# Patient Record
Sex: Female | Born: 1965 | Race: White | Hispanic: Yes | Marital: Married | Smoking: Never smoker
Health system: Southern US, Community
[De-identification: ages and names within clinical notes are randomized; demographics above are authoritative.]

## PROBLEM LIST (undated history)

## (undated) DIAGNOSIS — T148XXA Other injury of unspecified body region, initial encounter: Secondary | ICD-10-CM

## (undated) DIAGNOSIS — C50411 Malignant neoplasm of upper-outer quadrant of right female breast: Principal | ICD-10-CM

## (undated) DIAGNOSIS — C50919 Malignant neoplasm of unspecified site of unspecified female breast: Secondary | ICD-10-CM

## (undated) DIAGNOSIS — N2889 Other specified disorders of kidney and ureter: Secondary | ICD-10-CM

## (undated) HISTORY — PX: APPENDECTOMY: SHX54

## (undated) HISTORY — DX: Malignant neoplasm of upper-outer quadrant of right female breast: C50.411

## (undated) HISTORY — PX: BREAST BIOPSY: SHX20

---

## 2001-11-25 ENCOUNTER — Ambulatory Visit (HOSPITAL_COMMUNITY): Admission: RE | Admit: 2001-11-25 | Discharge: 2001-11-25 | Payer: Self-pay | Admitting: Family Medicine

## 2001-11-25 ENCOUNTER — Encounter: Payer: Self-pay | Admitting: Family Medicine

## 2003-05-27 ENCOUNTER — Other Ambulatory Visit: Admission: RE | Admit: 2003-05-27 | Discharge: 2003-05-27 | Payer: Self-pay | Admitting: Gynecology

## 2003-06-21 ENCOUNTER — Ambulatory Visit (HOSPITAL_COMMUNITY): Admission: RE | Admit: 2003-06-21 | Discharge: 2003-06-21 | Payer: Self-pay | Admitting: Gynecology

## 2006-05-14 ENCOUNTER — Inpatient Hospital Stay (HOSPITAL_COMMUNITY): Admission: AD | Admit: 2006-05-14 | Discharge: 2006-05-14 | Payer: Self-pay | Admitting: Family Medicine

## 2006-05-15 ENCOUNTER — Inpatient Hospital Stay: Admission: AD | Admit: 2006-05-15 | Discharge: 2006-05-15 | Payer: Self-pay | Admitting: Family Medicine

## 2006-05-15 ENCOUNTER — Inpatient Hospital Stay (HOSPITAL_COMMUNITY): Admission: AD | Admit: 2006-05-15 | Discharge: 2006-05-15 | Payer: Self-pay | Admitting: Gynecology

## 2011-10-09 ENCOUNTER — Ambulatory Visit: Payer: Self-pay | Admitting: Family Medicine

## 2011-10-09 VITALS — BP 126/75 | HR 66 | Temp 98.2°F | Resp 16 | Ht 61.0 in | Wt 157.0 lb

## 2011-10-09 DIAGNOSIS — R1031 Right lower quadrant pain: Secondary | ICD-10-CM

## 2011-10-09 LAB — POCT CBC
Granulocyte percent: 50.7 %G (ref 37–80)
HCT, POC: 38.2 % (ref 37.7–47.9)
Hemoglobin: 12.4 g/dL (ref 12.2–16.2)
Lymph, poc: 3.1 (ref 0.6–3.4)
MCH, POC: 28.8 pg (ref 27–31.2)
MCHC: 32.5 g/dL (ref 31.8–35.4)
MCV: 88.7 fL (ref 80–97)
MID (cbc): 0.5 (ref 0–0.9)
MPV: 10.1 fL (ref 0–99.8)
POC Granulocyte: 3.7 (ref 2–6.9)
POC LYMPH PERCENT: 42.9 %L (ref 10–50)
POC MID %: 6.4 %M (ref 0–12)
Platelet Count, POC: 226 10*3/uL (ref 142–424)
RBC: 4.31 M/uL (ref 4.04–5.48)
RDW, POC: 13 %
WBC: 7.2 10*3/uL (ref 4.6–10.2)

## 2011-10-09 LAB — POCT WET PREP WITH KOH
Clue Cells Wet Prep HPF POC: 20
KOH Prep POC: NEGATIVE
RBC Wet Prep HPF POC: NEGATIVE
Trichomonas, UA: NEGATIVE
Yeast Wet Prep HPF POC: NEGATIVE

## 2011-10-09 LAB — POCT UA - MICROSCOPIC ONLY
Casts, Ur, LPF, POC: NEGATIVE
Crystals, Ur, HPF, POC: NEGATIVE
Mucus, UA: NEGATIVE
Yeast, UA: NEGATIVE

## 2011-10-09 LAB — POCT URINALYSIS DIPSTICK
Bilirubin, UA: NEGATIVE
Blood, UA: NEGATIVE
Glucose, UA: NEGATIVE
Leukocytes, UA: NEGATIVE
Nitrite, UA: NEGATIVE
Spec Grav, UA: 1.015
Urobilinogen, UA: 0.2
pH, UA: 8

## 2011-10-09 LAB — POCT URINE PREGNANCY: Preg Test, Ur: NEGATIVE

## 2011-10-09 NOTE — Progress Notes (Signed)
  Subjective:    Patient ID: Kayla Johnston, female    DOB: 1966/06/09, 46 y.o.   MRN: [id number]  HPI    Review of Systems     Objective:   Physical Exam  Genitourinary: Vagina normal and uterus normal.       TTP 2-3cm higher than R ovary.  Wet prep taken.          Assessment & Plan:

## 2011-10-09 NOTE — Progress Notes (Signed)
This is a 46 year old Hispanic woman is seen with her partner with a history of 5 days of right lower quadrant pain. It lasted for. Was one month ago and was heavier than normal. She has a history of blood in the urine about 5 years ago. She's not sure she's had kidney stones in the past. The pain is sharp and radiates from costophrenic angle down to her right groin. Status post appendectomy 10 years ago  Objective: No acute distress, patient cooperative and is seen with her partner  Abdomen is soft, tender in the right lower quadrant pain without guarding or rebound  Respirations normal  Results for orders placed in visit on 10/09/11  POCT CBC      Component Value Range   WBC 7.2  4.6 - 10.2 (K/uL)   Lymph, poc 3.1  0.6 - 3.4    POC LYMPH PERCENT 42.9  10 - 50 (%L)   MID (cbc) 0.5  0 - 0.9    POC MID % 6.4  0 - 12 (%M)   POC Granulocyte 3.7  2 - 6.9    Granulocyte percent 50.7  37 - 80 (%G)   RBC 4.31  4.04 - 5.48 (M/uL)   Hemoglobin 12.4  12.2 - 16.2 (g/dL)   HCT, POC 16.1  09.6 - 47.9 (%)   MCV 88.7  80 - 97 (fL)   MCH, POC 28.8  27 - 31.2 (pg)   MCHC 32.5  31.8 - 35.4 (g/dL)   RDW, POC 04.5     Platelet Count, POC 226  142 - 424 (K/uL)   MPV 10.1  0 - 99.8 (fL)  POCT UA - MICROSCOPIC ONLY      Component Value Range   WBC, Ur, HPF, POC 1-2     RBC, urine, microscopic 3-5     Bacteria, U Microscopic 2+     Mucus, UA neg     Epithelial cells, urine per micros 3-5     Crystals, Ur, HPF, POC neg     Casts, Ur, LPF, POC neg     Yeast, UA neg    POCT URINALYSIS DIPSTICK      Component Value Range   Color, UA yellow     Clarity, UA cloudy     Glucose, UA neg     Bilirubin, UA neg     Ketones, UA trace     Spec Grav, UA 1.015     Blood, UA neg     pH, UA 8.0     Protein, UA trace     Urobilinogen, UA 0.2     Nitrite, UA neg     Leukocytes, UA Negative    A:  No evidence for kidney stone.  White count suggests no acute infection. P:  culturelle and miralax for now.   Return for worsening sx

## 2013-06-10 ENCOUNTER — Ambulatory Visit: Payer: Self-pay | Admitting: Physician Assistant

## 2013-06-10 VITALS — BP 124/78 | HR 69 | Temp 98.9°F | Resp 16 | Ht 61.0 in | Wt 158.2 lb

## 2013-06-10 DIAGNOSIS — N644 Mastodynia: Secondary | ICD-10-CM

## 2013-06-10 MED ORDER — MELOXICAM 15 MG PO TABS: 15.0000 mg | ORAL_TABLET | Freq: Every day | ORAL | Status: DC

## 2013-06-10 MED ORDER — TRAMADOL HCL 50 MG PO TABS: 50.0000 mg | ORAL_TABLET | Freq: Three times a day (TID) | ORAL | Status: DC | PRN

## 2013-06-10 NOTE — Patient Instructions (Signed)
Wear a supportive bra.  Use ice or heat to the area - whichever feels better.  Take the meloxicam once daily (do not take any additional advil, ibuprofen, or aleve.)  You can take Tylenol every 8 hours if needed.  If the pain is really bad (especially at night) you can take the tramadol.  You will get a phone call about scheduling your mammogram.

## 2013-06-10 NOTE — Progress Notes (Signed)
Subjective:    Patient ID: Telford Nab, female    DOB: 05/02/1966, 47 y.o.   MRN: [id number]  HPI   Ms. Duprey is a very pleasant 47 yr old female accompanied today by her husband.  She complains  Of sudden onset of a left breast knot and breast pain.  This started 3 days ago.  The breast lump and pain began at the same time.  She denies previous breast lumps.  She denies previous breast pain.  The right breast is unaffected.  She denies discharge from the breast.  She denies skin changes.  She denies rash.  States the pain wraps around under her left arm.  She describes the pain as burning.  Unsure about chicken pox history.  Difficult to sleep at night due to pain.  She has not taken anything for pain but states the pain is unbearable.  She denies fever or chills.  No associated symptoms.  She has never had a mammogram.  LMP 05/31/13.     Review of Systems  Constitutional: Negative for fever and chills.  Respiratory: Negative.   Cardiovascular: Negative.   Gastrointestinal: Negative.   Musculoskeletal: Negative.   Skin: Negative for color change, rash and wound.  Neurological: Negative.        Objective:   Physical Exam  Vitals reviewed. Constitutional: She is oriented to person, place, and time. She appears well-developed and well-nourished. No distress.  HENT:  Head: Normocephalic and atraumatic.  Eyes: Conjunctivae are normal. No scleral icterus.  Cardiovascular: Normal rate, regular rhythm and normal heart sounds.   Pulmonary/Chest: Effort normal and breath sounds normal. She has no wheezes. She has no rales. Right breast exhibits no mass, no nipple discharge, no skin change and no tenderness. Left breast exhibits tenderness. Left breast exhibits no nipple discharge and no skin change. Breasts are symmetrical.    Dense, tender area at approx 12 o'clock, just above areola; difficult to perform exam due to pain; no skin changes; pain is with palpation; no tenderness  into axilla or around to back  Abdominal: Soft. There is no tenderness.  Neurological: She is alert and oriented to person, place, and time.  Skin: Skin is warm and dry.  Psychiatric: She has a normal mood and affect. Her behavior is normal.        Assessment & Plan:  Breast pain, left - Plan: traMADol (ULTRAM) 50 MG tablet, meloxicam (MOBIC) 15 MG tablet, MM Digital Diagnostic Bilat, US Breast Left  Ms. Norell is a very pleasant 47 yr old female here with breast pain x 3 days.  On exam there are no skin changes, no nipple discharge.  The area just above the areola of the left breast is very tender with palpation.  Initially I questioned possible early shingles based on history but pain is clearly localized to the breast on exam.  She is afebrile, and there are no associated symptoms.  She has not tried anything for pain - will try Mobic and Tylenol.  Tramadol if needed.  Try hot or cold compresses. Supportive bra.  Order placed for mammogram and ultrasound - hope to obtain these as soon as possible.  Pt to RTC if worsening or new symptoms prior to mammo.   Meds ordered this encounter  Medications  . traMADol (ULTRAM) 50 MG tablet    Sig: Take 1 tablet (50 mg total) by mouth every 8 (eight) hours as needed.    Dispense:  30 tablet    Refill:  0  Order Specific Question:  Supervising Provider    Answer:  Ethelda Chick [[id number]]  . meloxicam (MOBIC) 15 MG tablet    Sig: Take 1 tablet (15 mg total) by mouth daily.    Dispense:  30 tablet    Refill:  1    Order Specific Question:  Supervising Provider    Answer:  Ethelda Chick [[id number]]    Loleta Dicker MHS, PA-C [facility] 11/19/20147:35 PM

## 2014-06-08 ENCOUNTER — Ambulatory Visit (INDEPENDENT_AMBULATORY_CARE_PROVIDER_SITE_OTHER): Payer: Self-pay | Admitting: Physician Assistant

## 2014-06-08 VITALS — BP 128/80 | HR 72 | Temp 98.4°F | Resp 17 | Ht 61.0 in | Wt 156.0 lb

## 2014-06-08 DIAGNOSIS — Z131 Encounter for screening for diabetes mellitus: Secondary | ICD-10-CM

## 2014-06-08 DIAGNOSIS — L299 Pruritus, unspecified: Secondary | ICD-10-CM

## 2014-06-08 DIAGNOSIS — R202 Paresthesia of skin: Secondary | ICD-10-CM

## 2014-06-08 DIAGNOSIS — L259 Unspecified contact dermatitis, unspecified cause: Secondary | ICD-10-CM

## 2014-06-08 DIAGNOSIS — R5383 Other fatigue: Secondary | ICD-10-CM

## 2014-06-08 LAB — POCT CBC
Granulocyte percent: 52.7 %G (ref 37–80)
HCT, POC: 43.4 % (ref 37.7–47.9)
Hemoglobin: 14 g/dL (ref 12.2–16.2)
Lymph, poc: 2.5 (ref 0.6–3.4)
MCH, POC: 29.1 pg (ref 27–31.2)
MCHC: 32.3 g/dL (ref 31.8–35.4)
MCV: 90 fL (ref 80–97)
MID (cbc): 0.4 (ref 0–0.9)
MPV: 8.6 fL (ref 0–99.8)
POC Granulocyte: 3.3 (ref 2–6.9)
POC LYMPH PERCENT: 41.1 %L (ref 10–50)
POC MID %: 6.2 %M (ref 0–12)
Platelet Count, POC: 221 10*3/uL (ref 142–424)
RBC: 4.83 M/uL (ref 4.04–5.48)
RDW, POC: 13.4 %
WBC: 6.2 10*3/uL (ref 4.6–10.2)

## 2014-06-08 LAB — GLUCOSE, POCT (MANUAL RESULT ENTRY): POC Glucose: 93 mg/dl (ref 70–99)

## 2014-06-08 MED ORDER — RANITIDINE HCL 150 MG PO TABS: 150.0000 mg | ORAL_TABLET | Freq: Two times a day (BID) | ORAL | Status: DC

## 2014-06-08 MED ORDER — PREDNISONE 20 MG PO TABS: ORAL_TABLET | ORAL | Status: AC

## 2014-06-08 MED ORDER — CETIRIZINE HCL 10 MG PO TABS: 10.0000 mg | ORAL_TABLET | Freq: Every day | ORAL | Status: DC

## 2014-06-08 NOTE — Progress Notes (Signed)
MRN: [id number] DOB: 1966/06/30  Subjective:   Kayla Johnston is a 48 y.o. female presenting for rash, fatigue and arm paresthesias.  Rash - started this am while at work, patient works for [employer], uses strong cleaning detergents, does not wear gloves, face ppe. States that her rash started on her cheeks bilaterally and was mildly uncomfortable progressed to severe itching and pain by 11:30. Rash has since spread to her lateral neck, right ear lobe, and forehead. She has tried Advil, sunblock cream and Zyrtec x1 with no relief. Denies eating new foods, using new cleaning agents, self hygiene products. Denies fevers, sob, wheezing, lip swelling, throat closing, hives, rash elsewhere on her body. Patient denies history of eczema, allergies, asthma. She does admit having had hives all over her body years ago, becoming sob, resolved on its own.  Fatigue - intermittent fatigue in the last year. No known precipitating factors. Admits stress at work can make her fatigue worse, feels down and blue intermittently without an obvious reason, feels dry coarse hair, intermittent constipation. Denies fevers, cough, night sweats, weight loss, joint swelling, photosensitivity. Cycles are regular, not heavy, no change in the last year. Generally sleeps well, 6-8 hours most nights. Diet is balanced, 3 meals a day, generally eats healthy occasional fatty meal. Denies smoking or alcohol use.  Arm paresthesias - felt intermittently mostly in her hands bilaterally to the finger tips but also occasionally in the forearm, worse in the evening after work. Massages sometimes help. No medications. No previous surgeries, neck pain, neck injuries, arm injuries, no foot paresthesias, decreased strength or loss of sensation.  Denies any other aggravating or relieving factors, no other questions or concerns.  No current medications.  No Known Allergies  No past medical history on file.  Past  Surgical History  Procedure Laterality Date  . Appendectomy     ROS As in subjective.   Objective:   Vitals: BP 128/80 mmHg  Pulse 72  Temp(Src) 98.4 F (36.9 C) (Oral)  Resp 17  Ht 5\' 1"  (1.549 m)  Wt 156 lb (70.761 kg)  BMI 29.49 kg/m2  SpO2 100%  LMP 06/08/2014  Physical Exam  Constitutional: She is oriented to person, place, and time and well-developed, well-nourished, and in no distress. No distress.  HENT:  Head: Normocephalic.  Right Ear: External ear normal.  Left Ear: External ear normal.  Nose: Nose normal.  Mouth/Throat: Oropharynx is clear and moist. No oropharyngeal exudate.  Eyes: Conjunctivae are normal. Pupils are equal, round, and reactive to light. Right eye exhibits no discharge. Left eye exhibits no discharge. No scleral icterus.  Neck: Normal range of motion. Neck supple. No thyromegaly present.  Cardiovascular: Normal rate, regular rhythm, normal heart sounds and intact distal pulses.  Exam reveals no gallop and no friction rub.   No murmur heard. Pulmonary/Chest: Effort normal and breath sounds normal. No stridor. No respiratory distress. She has no wheezes. She has no rales.  Abdominal: Soft. Bowel sounds are normal. She exhibits no distension and no mass. There is no tenderness.  Musculoskeletal: Normal range of motion. She exhibits no edema.  Lymphadenopathy:    She has no cervical adenopathy.  Neurological: She is alert and oriented to person, place, and time. She has normal reflexes.  Skin: Skin is warm and dry. Rash (macular papular rash over malar area, right lower ear lobe, hairline and lateral neck bilaterally) noted. Rash is not pustular, not vesicular and not urticarial. She is not diaphoretic. No erythema.  Psychiatric: Her mood appears anxious. Her affect is not labile. She is not agitated. She does not exhibit a depressed mood.    Results for orders placed or performed in visit on 06/08/14 (from the past 24 hour(s))  POCT glucose  (manual entry)     Status: None   Collection Time: 06/08/14  8:08 PM  Result Value Ref Range   POC Glucose 93 70 - 99 mg/dl  POCT CBC     Status: None   Collection Time: 06/08/14  8:09 PM  Result Value Ref Range   WBC 6.2 4.6 - 10.2 K/uL   Lymph, poc 2.5 0.6 - 3.4   POC LYMPH PERCENT 41.1 10 - 50 %L   MID (cbc) 0.4 0 - 0.9   POC MID % 6.2 0 - 12 %M   POC Granulocyte 3.3 2 - 6.9   Granulocyte percent 52.7 37 - 80 %G   RBC 4.83 4.04 - 5.48 M/uL   Hemoglobin 14.0 12.2 - 16.2 g/dL   HCT, POC 43.4 37.7 - 47.9 %   MCV 90.0 80 - 97 fL   MCH, POC 29.1 27 - 31.2 pg   MCHC 32.3 31.8 - 35.4 g/dL   RDW, POC 13.4 %   Platelet Count, POC 221 142 - 424 K/uL   MPV 8.6 0 - 99.8 fL    Assessment and Plan :   1. Contact dermatitis 2. Itching - Possibly due to cleaning agents from work, Rx steroid course, antihistamines, return to clinic if symptoms worsen, fail to resolve or as needed - predniSONE (DELTASONE) 20 MG tablet; Take 3 PO QAM x3days, 2 PO QAM x3days, 1 PO QAM x3days  Dispense: 18 tablet; Refill: 0 - cetirizine (ZYRTEC) 10 MG tablet; Take 1 tablet (10 mg total) by mouth daily.  Dispense: 30 tablet; Refill: 11 - ranitidine (ZANTAC) 150 MG tablet; Take 1 tablet (150 mg total) by mouth 2 (two) times daily.  Dispense: 60 tablet; Refill: 0  3. Other fatigue - Consider Hypothyroidism, labs pending - POCT CBC - TSH  4. Screening for diabetes mellitus - R/o diabetes d/t steroid use - POCT glucose (manual entry)  5. Paresthesia of hand, bilateral - Possible carpal tunnel syndrome, try wrist splint at night on right hand for 2 weeks, return to clinic if symptoms worsen, fail to resolve or as needed   Jaynee Eagles, PA-C [facility] [phone] 06/08/2014 9:06 PM

## 2014-06-08 NOTE — Patient Instructions (Addendum)
Tome 3 pastillas de 58m de Prednisone por 3 dias. Luego tome 2 pastillas por los siguientes 3 dias. Para terminar tome 1 pastilla por 3 mas dias hasta que se le acabe su medicamento, Prednisone.  Sus resultados dJournalist, newspaper Yo le mandare por correo sus resultados y le hablare dentro de dDIRECTV  Use una ferula de muneca para su brazo derecho por la noche por 2 semanas.   Por favor regrese para un examen fisico antes del ano nuevo.    Dermatitis de contacto (Contact Dermatitis) La dermatitis de contacto es una reaccin a ciertas sustancias que tocan la piel. Puede ser uArdelia Memsdermatitis de contacto irritante o alrgica. La dermatitis de contacto irritante no requiere exposicin previa a la sustancia que provoc la reaccin.La dermatitis alrgica slo ocurre si ha estado expuesto anteriormente a la sustancia. Al repetir la exposicin, el organismo reacciona a la sustancia.  CAUSAS  Muchas sustancias pueden causar dermatitis de contacto. La dermatitis irritante se produce cuando hay exposicin repetida a sustancias levemente irritantes, como por ejemplo:   Maquillaje.  Jabones.  Detergentes.  Lavandina.  cidos.  Sales metlicas, como el nquel. Las causas de la dermatitis alrgica son:   Plantas venenosas.  Sustancias qumicas (desodorantes, champs).  Bijouterie.  Ltex.  Neomicina en cremas con triple antibitico.  Conservantes en productos incluyendo en la ropa. SNTOMAS  En la zona de la piel que ha estado expuesta puede haber:   Sequedad o descamacin.  Enrojecimiento.  Grietas.  Picazn.  Dolor o sensacin de ardor.  Ampollas. En el caso de la dermatitis de cRisk manager puede haber slo hinchazn en algunas zonas, como la boca o los genitales.  DIAGNSTICO  El mdico podr hacer el diagnstico realizando un examen fsico. En los casos en que la causa es incierta y se sospecha una dermatitis de cPuhi le har una prueba en la  piel con un parche para determinar la causa de la dermatitis. TRATAMIENTO  El tratamiento incluye la proteccin de la piel de nuevos contactos con la sustancia irritante, evitando la sustancia en lo posible. Puede ser de utilidad colocar una barrera como cremas, polvos y gPort Austin El mdico tambin podr recomendar:   Cremas o pomadas con corticoides aplicadas 2 veces por da. Para un mejor efecto, humedezca la zona con agua fresca durante 20 minutos. Luego aplique el medicamento. Cubra la zona con un vendaje plstico. Puede almacenar la crema con corticoides en el refrigerador para tResearch scientist (medical)"refrescante" sobre la erupcin que har aliviar la picazn. Esto aliviar la picazn. En los casos ms graves ser necesario aplicar corticoides por va oral.  Ungentos con antibiticos o antibacterianos, si hay una infeccin en la piel.  Antihistamnicos en forma de locin o por va oral para calmar la picazn.  Lubricantes para mantener la humectacin de la piel.  La solucin de [place] para reducir el enrojecimiento y eConservation officer, historic buildingso para secar una erupcin que supura. Mezcle un paquete o tableta en dos tazas de agua fra. Moje un pao limpio en la solucin, escrralo un poco y colquelo en el rea afectada. Djelo en el lugar durante 30 minutos. Repita el procedimiento todas las veces que pueda a lo largo del dTraining and development officer  Hgase baos con almidn o bicarbonato todos los das si la zona es demasiado extensa como para cubrirla con una toallita. Algunas sustancias qumicas, como los lcalis o los cidos pueden daar la piel del mismo modo que uPlymouth Enjuague la piel durante 15 a  20 minutos con agua fra despus de la exposicin a esas sustancias. Tambin busque atencin mdica de inmediato. En los casos de piel muy irritada, ser necesario aplicar (vendajes), antibiticos y analgsicos.  INSTRUCCIONES PARA EL CUIDADO EN EL HOGAR   Evite lo que ha causado la erupcin.  Mantenga el rea de la piel afectada  sin contacto con el agua caliente, el jabn, la luz solar, las sustancias qumicas, sustancias cidas o todo lo que la irrite.  No se rasque la lesin. El rascado puede hacer que la erupcin se infecte.  Puede tomar baos con agua fresca para detener la picazn.  Tome slo medicamentos de venta libre o recetados, segn las indicaciones del mdico.  Consulting civil engineer a las visitas de control segn las indicaciones, para asegurarse de que la piel se est curando Product manager. SOLICITE ATENCIN MDICA SI:   El problema no mejora luego de 3 das de [place].  Se siente empeorar.  Observa signos de infeccin, como hinchazn, sensibilidad, inflamacin, enrojecimiento o aumenta la temperatura en la zona afectada.  Tiene nuevos problemas debido a los medicamentos. Document Released: 04/18/2005 Document Revised: 10/01/2011 [facility] Patient Information 2015 [place]. This information is not intended to replace advice given to you by your health care provider. Make sure you discuss any questions you have with your health care provider.     Sndrome del tnel carpiano  (Carpal Tunnel Syndrome)  El tnel carpiano es un espacio estrecho ubicado en el lado palmar de la [place]. Est formado por los huesos de la [place] y los ligamentos. Los nervios, vasos sanguneos y tendones pasan a travs del tnel carpiano. Los movimientos de la [place] o ciertas enfermedades pueden causar hinchazn del tnel. Esta hinchazn comprime el nervio principal en la mueca ((nervio mediano) y ocasiona un trastorno doloroso que se denomina sndrome del tnel carpiano. CAUSAS   Movimientos repetidos de [employer].  Lesiones en la [place].  Ciertas enfermedades como la artritis, la diabetes, el alcoholismo, el hipertiroidismo o la insuficiencia renal.  Obesidad.  Embarazo. SNTOMAS   Sensacin de "pinchazos" en los dedos o la mano.  Hormigueo o entumecimiento en los dedos o en la mano.  Sensacin dolorosa en todo  el brazo.  Dolor en la mueca que sube por el brazo hasta el hombro.  Dolor que baja por la mano o los dedos.  Sensacin de [employer]. DIAGNSTICO  El mdico le har una historia clnica y un examen fsico. Puede ser necesario hacer un electromiograma. Esta prueba mide las seales elctricas enviadas por los msculos. Generalmente el paso de las seales elctricas es impedido por el sndrome del tnel carpiano. Tambin puede ser necesario que le tomen radiografas.  TRATAMIENTO  El sndrome del tnel carpiano puede curarse espontneamente sin tratamiento. El [employer] indicar el uso de un cabestrillo para la [place] o que tome medicamentos como antiinflamatorios no esteroides. Las inyecciones de cortisona pueden ayudar. A veces es necesaria la ciruga para liberar el nervio comprimido.  Moclips todos los medicamentos segn le indic su mdico. Slo tome medicamentos de venta libre o recetados para Glass blower/designer, las molestias o bajar la fiebre segn las indicaciones de su mdico.  Si le aconsejaron usar un cabestrillo para evitar que la [place] se doble, selo como le indicaron. Es importante que use el cabestrillo durante la noche. selo mientras sienta dolor o adormecimiento en la mano, el brazo o la [place]. Esto puede durar entre 1 y  2 meses.  Haga reposar la [place] de toda actividad que le cause dolor. Si sus sntomas estn relacionados con Leander Rams, deber conversar con su empleador acerca de la posibilidad de cambiar a una tarea que no requiera el uso de la [place].  Aplique hielo en la mueca despus de los perodos prolongados de [place].  Ponga el hielo en una bolsa plstica.  Colquese una toalla entre la piel y la bolsa de hielo.  Deje el hielo en el lugar durante 15 a 20 minutos, 3 a 4 veces por da.  Cumpla con todas las visitas de control, segn le indique su mdico. Aqu se incluyen derivaciones a un ortopedista,  fisioterapia y rehabilitacin. Elmer Bales en obtener la asistencia necesaria puede dar como resultado una demora o fracaso en la curacin. SOLICITE ATENCIN MDICA DE INMEDIATO SI:   Desarrolla nuevos e inexplicables sntomas.  Los sntomas actuales empeoran y la medicacin no los [place]. ASEGRESE DE QUE:   Comprende estas instrucciones.  Controlar su enfermedad.  Solicitar ayuda de inmediato si no mejora o si empeora. Document Released: 07/09/2005 Document Revised: 04/02/2012 [facility] Patient Information 2015 [place]. This information is not intended to replace advice given to you by your health care provider. Make sure you discuss any questions you have with your health care provider.

## 2014-06-09 LAB — TSH: TSH: 4.284 u[IU]/mL (ref 0.350–4.500)

## 2014-06-10 ENCOUNTER — Encounter: Payer: Self-pay | Admitting: Urgent Care

## 2014-06-10 ENCOUNTER — Telehealth: Payer: Self-pay | Admitting: Urgent Care

## 2014-06-10 NOTE — Telephone Encounter (Signed)
Spoke with patient about results and plan for follow up. Advised that if rash worsens or does not resolve after 1-2 weeks, please return to clinic for re-evaluation. Advised her to schedule a physical exam as well since she has not had one in years.  Jaynee Eagles, PA-C [facility] [phone] 06/10/2014  3:41 PM

## 2014-06-11 NOTE — Progress Notes (Signed)
I was directly involved with the patient's care and agree with the diagnosis and treatment plan.

## 2015-06-25 ENCOUNTER — Emergency Department (HOSPITAL_COMMUNITY)
Admission: EM | Admit: 2015-06-25 | Discharge: 2015-06-25 | Disposition: A | Discharge status: Home / Self Care | Payer: Self-pay | Attending: Emergency Medicine | Admitting: Emergency Medicine

## 2015-06-25 ENCOUNTER — Encounter (HOSPITAL_COMMUNITY): Payer: Self-pay | Admitting: Emergency Medicine

## 2015-06-25 DIAGNOSIS — N644 Mastodynia: Secondary | ICD-10-CM | POA: Insufficient documentation

## 2015-06-25 DIAGNOSIS — Z791 Long term (current) use of non-steroidal anti-inflammatories (NSAID): Secondary | ICD-10-CM | POA: Insufficient documentation

## 2015-06-25 DIAGNOSIS — R110 Nausea: Secondary | ICD-10-CM | POA: Insufficient documentation

## 2015-06-25 DIAGNOSIS — Z79899 Other long term (current) drug therapy: Secondary | ICD-10-CM | POA: Insufficient documentation

## 2015-06-25 MED ORDER — HYDROMORPHONE HCL 1 MG/ML IJ SOLN
0.5000 mg | Freq: Once | INTRAMUSCULAR | Status: AC
  Administered 2015-06-25: 0.5 mg via INTRAVENOUS
  Filled 2015-06-25: qty 1

## 2015-06-25 MED ORDER — HYDROCODONE-ACETAMINOPHEN 10-325 MG PO TABS: 1.0000 | ORAL_TABLET | Freq: Four times a day (QID) | ORAL | Status: DC | PRN

## 2015-06-25 MED ORDER — SODIUM CHLORIDE 0.9 % IV BOLUS (SEPSIS)
1000.0000 mL | Freq: Once | INTRAVENOUS | Status: AC
  Administered 2015-06-25: 1000 mL via INTRAVENOUS

## 2015-06-25 MED ORDER — ONDANSETRON HCL 4 MG/2ML IJ SOLN
4.0000 mg | Freq: Once | INTRAMUSCULAR | Status: AC
  Administered 2015-06-25: 4 mg via INTRAVENOUS
  Filled 2015-06-25: qty 2

## 2015-06-25 MED ORDER — ONDANSETRON 4 MG PO TBDP: 4.0000 mg | ORAL_TABLET | Freq: Three times a day (TID) | ORAL | Status: DC | PRN

## 2015-06-25 NOTE — Discharge Instructions (Signed)
Please be sure to go to the breast clinic as scheduled in four days.  Return here for concerning changes in your condition.

## 2015-06-25 NOTE — ED Provider Notes (Signed)
CSN: [id number]     Arrival date & time 06/25/15  1300 History   First MD Initiated Contact with Patient 06/25/15 1333     Chief Complaint  Patient presents with  . nausea/pain     Patient presents with concern of pain, swelling, nausea. Pain is focally in the right lateral breast, radiating towards the right arm, right scapula. Patient has been present for at least one week, but possibly subtly more so. Over the past few days in particular, the patient has had increasingly severe, sharp pain, nausea, anorexia. No vomiting, no chest pain, no dyspnea. Patient has been taking tramadol, NSAIDs without relief. No new fever, confusion, disorientation. Patient has scheduled ultrasound of the breast on December 7, in 4 days. Patient has no history of cancer herself, denies substantial medical problems. HPI  History reviewed. No pertinent past medical history. Past Surgical History  Procedure Laterality Date  . Appendectomy     Family History  Problem Relation Age of Onset  . Cancer Sister   . Cancer Paternal Aunt    Social History  Substance Use Topics  . Smoking status: Never Smoker   . Smokeless tobacco: None  . Alcohol Use: No   OB History    No data available     Review of Systems  Constitutional:       Per HPI, otherwise negative  HENT:       Per HPI, otherwise negative  Respiratory:       Per HPI, otherwise negative  Cardiovascular:       Per HPI, otherwise negative  Gastrointestinal: Negative for vomiting.  Endocrine:       Negative aside from HPI  Genitourinary:       Neg aside from HPI   Musculoskeletal:       Per HPI, otherwise negative  Skin: Negative.   Neurological: Negative for syncope.      Allergies  Review of patient's allergies indicates no known allergies.  Home Medications   Prior to Admission medications   Medication Sig Start Date End Date Taking? Authorizing Provider  meloxicam (MOBIC) 15 MG tablet Take 15 mg by mouth daily.   Yes  Historical Provider, MD  ranitidine (ZANTAC) 150 MG tablet Take 1 tablet (150 mg total) by mouth 2 (two) times daily. 06/08/14  Yes Jaynee Eagles, PA-C  traMADol (ULTRAM) 50 MG tablet Take 50 mg by mouth every 8 (eight) hours as needed for moderate pain.   Yes Historical Provider, MD  cetirizine (ZYRTEC) 10 MG tablet Take 1 tablet (10 mg total) by mouth daily. Patient not taking: Reported on 06/25/2015 06/08/14   Jaynee Eagles, PA-C   BP 155/87 mmHg  Pulse 73  Temp(Src) 98.4 F (36.9 C) (Oral)  Resp 16  SpO2 100%  LMP 06/11/2015 Physical Exam  Constitutional: She is oriented to person, place, and time. She appears well-developed and well-nourished. No distress.  HENT:  Head: Normocephalic and atraumatic.  Eyes: Conjunctivae and EOM are normal.  Cardiovascular: Normal rate and regular rhythm.   Pulmonary/Chest: Effort normal and breath sounds normal. No stridor. No respiratory distress.    Abdominal: She exhibits no distension.  Musculoskeletal: She exhibits no edema.  Lymphadenopathy:  No appreciable right axilla adenopathy  Neurological: She is alert and oriented to person, place, and time. No cranial nerve deficit.  Skin: Skin is warm and dry.  Psychiatric: She has a normal mood and affect.  Nursing note and vitals reviewed.   ED Course  Procedures (including critical care  time)  3:42 PM With IVF, zofran, dilaudid, the Sx are improved,though there is a bit of dizziness.  Patient will f/u at the breast center, and will call in two days (Monday) to see if the appointment can be expedited.   MDM  Patient presents with concern of ongoing pain in the right lateral breast. Here the patient is afebrile, and there are no superficial changes consistent with mastitis. Low suspicion for deep space infection. Patient improved here with pain medication, antiemetics and patient already has ultrasound appointment in less than 4 days.   Carmin Muskrat, MD 06/25/15 [phone]

## 2015-06-25 NOTE — ED Notes (Signed)
Per pt, states right breast mass-has appointment on the 12 th-states increased pain causing nausea

## 2015-06-25 NOTE — ED Notes (Signed)
Pt also endorses the pain radiating down her R arm and R scapula.

## 2015-06-30 ENCOUNTER — Ambulatory Visit (HOSPITAL_COMMUNITY)
Admission: RE | Admit: 2015-06-30 | Discharge: 2015-06-30 | Disposition: A | Discharge status: Home / Self Care | Payer: Self-pay | Source: Ambulatory Visit | Attending: Obstetrics and Gynecology | Admitting: Obstetrics and Gynecology

## 2015-06-30 ENCOUNTER — Encounter (HOSPITAL_COMMUNITY): Payer: Self-pay

## 2015-06-30 ENCOUNTER — Other Ambulatory Visit: Payer: Self-pay | Admitting: Radiology

## 2015-06-30 VITALS — BP 112/72 | Temp 98.6°F | Ht 63.0 in | Wt 164.0 lb

## 2015-06-30 DIAGNOSIS — N6311 Unspecified lump in the right breast, upper outer quadrant: Secondary | ICD-10-CM

## 2015-06-30 DIAGNOSIS — Z1239 Encounter for other screening for malignant neoplasm of breast: Secondary | ICD-10-CM

## 2015-06-30 NOTE — Patient Instructions (Signed)
Educational materials on self breast awareness given. Explained to Kayla Johnston that she did not need a Pap smear today due to last Pap smear was in August 2014 per patient. Let her know [facility] will cover Pap smears every 3 years unless has a history of abnormal Pap smears. Referred patient to [facility] for a right breast biopsy per recommendation. Appointment scheduled for Thursday, June 30, 2015 at 1500. Patient aware of appointment and will be there. Kayla Johnston verbalized understanding.  Jaeshaun Riva, Arvil [place], RN 1:09 PM

## 2015-06-30 NOTE — Progress Notes (Signed)
Patient referred to [facility] by [facility] due to recommending a right breast biopsy x 2. Diagnostic mammogram completed 06/29/2015 at [facility]. Complaints of right breast lump x 1 month. Complaints of right breast pain x 1 week that comes and goes. Patient rated pain at a 10 out of 10.  Pap Smear:  Pap smear not completed today. Last Pap smear was in August 2014 and normal per patient. Per patient has no history of an abnormal Pap smear. No Pap smear results in EPIC.  Physical exam: Breasts Breasts symmetrical. No skin abnormalities bilateral breasts. No nipple retraction bilateral breasts. No nipple discharge bilateral breasts. No lymphadenopathy. No lumps palpated left breast. Palpated a lump within the right breast at 10 o'clock 2 cm from the nipple. Patient complained of tenderness when palpated left lower breast and right outer breast on exam. Referred patient to [facility] for a right breast biopsy per recommendation. Appointment scheduled for Thursday, June 30, 2015 at 1500.    Pelvic/Bimanual No Pap smear completed today since last Pap smear was in August 2014 per patient. Pap smear not indicated per [facility]CP guidelines.   Used interpreter Benjamine Sprague.

## 2015-07-04 ENCOUNTER — Other Ambulatory Visit: Payer: Self-pay | Admitting: Radiology

## 2015-07-04 DIAGNOSIS — C801 Malignant (primary) neoplasm, unspecified: Secondary | ICD-10-CM

## 2015-07-05 ENCOUNTER — Encounter: Payer: Self-pay | Admitting: *Deleted

## 2015-07-05 ENCOUNTER — Emergency Department (HOSPITAL_COMMUNITY): Payer: Self-pay

## 2015-07-05 ENCOUNTER — Encounter (HOSPITAL_COMMUNITY): Payer: Self-pay | Admitting: Emergency Medicine

## 2015-07-05 ENCOUNTER — Telehealth: Payer: Self-pay | Admitting: *Deleted

## 2015-07-05 ENCOUNTER — Emergency Department (HOSPITAL_COMMUNITY)
Admission: EM | Admit: 2015-07-05 | Discharge: 2015-07-05 | Disposition: A | Discharge status: Home / Self Care | Payer: Self-pay | Attending: Emergency Medicine | Admitting: Emergency Medicine

## 2015-07-05 DIAGNOSIS — C50411 Malignant neoplasm of upper-outer quadrant of right female breast: Secondary | ICD-10-CM

## 2015-07-05 DIAGNOSIS — Z853 Personal history of malignant neoplasm of breast: Secondary | ICD-10-CM | POA: Insufficient documentation

## 2015-07-05 DIAGNOSIS — J069 Acute upper respiratory infection, unspecified: Secondary | ICD-10-CM | POA: Insufficient documentation

## 2015-07-05 DIAGNOSIS — B9789 Other viral agents as the cause of diseases classified elsewhere: Secondary | ICD-10-CM

## 2015-07-05 DIAGNOSIS — Z9889 Other specified postprocedural states: Secondary | ICD-10-CM | POA: Insufficient documentation

## 2015-07-05 DIAGNOSIS — J988 Other specified respiratory disorders: Secondary | ICD-10-CM

## 2015-07-05 HISTORY — DX: Malignant neoplasm of unspecified site of unspecified female breast: C50.919

## 2015-07-05 HISTORY — DX: Malignant neoplasm of upper-outer quadrant of right female breast: C50.411

## 2015-07-05 LAB — RAPID STREP SCREEN (MED CTR MEBANE ONLY): Streptococcus, Group A Screen (Direct): NEGATIVE

## 2015-07-05 MED ORDER — HYDROCODONE-ACETAMINOPHEN 7.5-325 MG/15ML PO SOLN: ORAL | Status: DC

## 2015-07-05 MED ORDER — ALBUTEROL SULFATE HFA 108 (90 BASE) MCG/ACT IN AERS
2.0000 | INHALATION_SPRAY | RESPIRATORY_TRACT | Status: DC | PRN
  Administered 2015-07-05: 2 via RESPIRATORY_TRACT
  Filled 2015-07-05: qty 6.7

## 2015-07-05 NOTE — ED Notes (Signed)
Pt is c/o chills, cough, sore throat, fever  Pt states sxs started last week

## 2015-07-05 NOTE — Telephone Encounter (Signed)
Confirmed [facility] for 07/06/15 at 0830 .  Instructions and contact information given. 

## 2015-07-05 NOTE — ED Provider Notes (Addendum)
CSN: [id number]     Arrival date & time 07/05/15  0139 History   First MD Initiated Contact with Patient 07/05/15 0409     Chief Complaint  Patient presents with  . Sore Throat     (Consider location/radiation/quality/duration/timing/severity/associated sxs/prior Treatment) HPI  This is a 49 year old female who underwent a breast biopsy 5 days ago. Since that time she has had a sore throat which has become moderate to severe, worse with swallowing. It is been accompanied by chills, subjective fever, cough and shortness of breath. She denies vomiting or diarrhea.  Past Medical History  Diagnosis Date  . Breast cancer [facility])    Past Surgical History  Procedure Laterality Date  . Appendectomy    . Breast biopsy     Family History  Problem Relation Age of Onset  . Cancer Sister     cervical   Social History  Substance Use Topics  . Smoking status: Never Smoker   . Smokeless tobacco: Never Used  . Alcohol Use: No   OB History    Gravida Para Term Preterm AB TAB SAB Ectopic Multiple Living   3    3  2 1   0     Review of Systems  All other systems reviewed and are negative.   Allergies  Review of patient's allergies indicates no known allergies.  Home Medications   Prior to Admission medications   Medication Sig Start Date End Date Taking? Authorizing Provider  acetaminophen (TYLENOL) 500 MG tablet Take 1,000 mg by mouth every 6 (six) hours as needed for mild pain.   Yes Historical Provider, MD  cetirizine (ZYRTEC) 10 MG tablet Take 1 tablet (10 mg total) by mouth daily. Patient not taking: Reported on 06/25/2015 06/08/14   Jaynee Eagles, PA-C  HYDROcodone-acetaminophen [facility]) 10-325 MG tablet Take 1 tablet by mouth every 6 (six) hours as needed for severe pain. Patient not taking: Reported on 07/05/2015 06/25/15   Carmin Muskrat, MD  ondansetron (ZOFRAN-ODT) 4 MG disintegrating tablet Take 1 tablet (4 mg total) by mouth every 8 (eight) hours as needed for nausea or  vomiting. Patient not taking: Reported on 06/30/2015 06/25/15   Carmin Muskrat, MD   BP 126/78 mmHg  Pulse 74  Temp(Src) 98.2 F (36.8 C) (Oral)  Resp 24  SpO2 96%  LMP 06/11/2015   Physical Exam  General: Well-developed, well-nourished female in no acute distress; appearance consistent with age of record HENT: normocephalic; atraumatic; no pharyngeal erythema or exudate Eyes: pupils equal, round and reactive to light; extraocular muscles intact Neck: supple; anterior cervical lymphadenopathy, L>R Heart: regular rate and rhythm Lungs: Decreased air movement bilaterally without wheezing Abdomen: soft; nondistended; nontender; bowel sounds present Extremities: No deformity; full range of motion; pulses normal Neurologic: Awake, alert and oriented; motor function intact in all extremities and symmetric; no facial droop Skin: Warm and dry Psychiatric: Flat affect    ED Course  Procedures (including critical care time)   MDM  Nursing notes and vitals signs, including pulse oximetry, reviewed.  Summary of this visit's results, reviewed by myself:  Labs:  Results for orders placed or performed during the hospital encounter of 07/05/15 (from the past 24 hour(s))  Rapid strep screen     Status: None   Collection Time: 07/05/15  4:20 AM  Result Value Ref Range   Streptococcus, Group A Screen (Direct) NEGATIVE NEGATIVE    Imaging Studies: Dg Chest 2 View  07/05/2015  CLINICAL DATA:  Acute onset of chills, cough, sore throat  and fever. Initial encounter. EXAM: CHEST  2 VIEW COMPARISON:  None. FINDINGS: The lungs are well-aerated and clear. There is no evidence of focal opacification, pleural effusion or pneumothorax. The heart is normal in size; the mediastinal contour is within normal limits. No acute osseous abnormalities are seen. IMPRESSION: No acute cardiopulmonary process seen. Electronically Signed   By: Garald Balding M.D.   On: 07/05/2015 04:59   Likely represents a viral  syndrome. Postoperative irritation due to endotracheal intubation is a possibility but would not explain the patient's other symptomatology.    Shanon Rosser, MD 07/05/15 0530  Shanon Rosser, MD 07/05/15 [phone]

## 2015-07-05 NOTE — ED Notes (Signed)
Spouse states pt recently had a breast biopsy and they received a call stating that it was positive for cancer  Pt is to see oncology next week

## 2015-07-05 NOTE — ED Notes (Signed)
Bed: WA04 Expected date:  Expected time:  Means of arrival:  Comments: 

## 2015-07-05 NOTE — ED Notes (Signed)
Pt ambulating independently w/ steady gait on d/c in no acute distress, A&Ox4. D/c instructions reviewed w/ pt and family - pt and family deny any further questions or concerns at present. Rx given x1  

## 2015-07-06 ENCOUNTER — Ambulatory Visit: Payer: Self-pay | Admitting: Physical Therapy

## 2015-07-06 ENCOUNTER — Ambulatory Visit (HOSPITAL_BASED_OUTPATIENT_CLINIC_OR_DEPARTMENT_OTHER): Payer: Self-pay | Admitting: Oncology

## 2015-07-06 ENCOUNTER — Encounter: Payer: Self-pay | Admitting: Oncology

## 2015-07-06 ENCOUNTER — Encounter: Payer: Self-pay | Admitting: Nurse Practitioner

## 2015-07-06 ENCOUNTER — Ambulatory Visit: Payer: Self-pay | Admitting: Surgery

## 2015-07-06 ENCOUNTER — Other Ambulatory Visit (HOSPITAL_BASED_OUTPATIENT_CLINIC_OR_DEPARTMENT_OTHER): Payer: Self-pay

## 2015-07-06 ENCOUNTER — Encounter: Payer: Self-pay | Admitting: Skilled Nursing Facility1

## 2015-07-06 ENCOUNTER — Encounter: Payer: Self-pay | Admitting: Physical Therapy

## 2015-07-06 ENCOUNTER — Encounter: Payer: Self-pay | Admitting: *Deleted

## 2015-07-06 ENCOUNTER — Ambulatory Visit: Payer: Self-pay | Attending: Surgery | Admitting: Physical Therapy

## 2015-07-06 ENCOUNTER — Ambulatory Visit
Admission: RE | Admit: 2015-07-06 | Discharge: 2015-07-06 | Disposition: A | Discharge status: Home / Self Care | Payer: Self-pay | Source: Ambulatory Visit | Attending: Radiation Oncology | Admitting: Radiation Oncology

## 2015-07-06 VITALS — BP 132/75 | HR 70 | Temp 98.2°F | Resp 18 | Ht 63.0 in | Wt 161.7 lb

## 2015-07-06 DIAGNOSIS — R293 Abnormal posture: Secondary | ICD-10-CM | POA: Insufficient documentation

## 2015-07-06 DIAGNOSIS — C50411 Malignant neoplasm of upper-outer quadrant of right female breast: Secondary | ICD-10-CM

## 2015-07-06 DIAGNOSIS — C50911 Malignant neoplasm of unspecified site of right female breast: Secondary | ICD-10-CM | POA: Insufficient documentation

## 2015-07-06 DIAGNOSIS — C50111 Malignant neoplasm of central portion of right female breast: Secondary | ICD-10-CM

## 2015-07-06 DIAGNOSIS — Z170 Estrogen receptor positive status [ER+]: Secondary | ICD-10-CM

## 2015-07-06 LAB — COMPREHENSIVE METABOLIC PANEL
ALT: 18 U/L (ref 0–55)
AST: 16 U/L (ref 5–34)
Albumin: 3.8 g/dL (ref 3.5–5.0)
Alkaline Phosphatase: 77 U/L (ref 40–150)
Anion Gap: 9 mEq/L (ref 3–11)
BUN: 10.2 mg/dL (ref 7.0–26.0)
CO2: 23 mEq/L (ref 22–29)
Calcium: 9.2 mg/dL (ref 8.4–10.4)
Chloride: 107 mEq/L (ref 98–109)
Creatinine: 0.8 mg/dL (ref 0.6–1.1)
EGFR: 85 mL/min/{1.73_m2} — ABNORMAL LOW (ref >90)
Glucose: 117 mg/dl (ref 70–140)
Potassium: 3.7 mEq/L (ref 3.5–5.1)
Sodium: 139 mEq/L (ref 136–145)
Total Bilirubin: 0.65 mg/dL (ref 0.20–1.20)
Total Protein: 7.5 g/dL (ref 6.4–8.3)

## 2015-07-06 LAB — CBC WITH DIFFERENTIAL/PLATELET
BASO%: 0.3 % (ref 0.0–2.0)
Basophils Absolute: 0 10*3/uL (ref 0.0–0.1)
EOS%: 1 % (ref 0.0–7.0)
Eosinophils Absolute: 0.1 10*3/uL (ref 0.0–0.5)
HCT: 40.6 % (ref 34.8–46.6)
HGB: 13.5 g/dL (ref 11.6–15.9)
LYMPH%: 13.7 % — ABNORMAL LOW (ref 14.0–49.7)
MCH: 29.3 pg (ref 25.1–34.0)
MCHC: 33.3 g/dL (ref 31.5–36.0)
MCV: 88 fL (ref 79.5–101.0)
MONO#: 0.4 10*3/uL (ref 0.1–0.9)
MONO%: 4.6 % (ref 0.0–14.0)
NEUT#: 7.5 10*3/uL — ABNORMAL HIGH (ref 1.5–6.5)
NEUT%: 80.4 % — ABNORMAL HIGH (ref 38.4–76.8)
Platelets: 211 10*3/uL (ref 145–400)
RBC: 4.61 10*6/uL (ref 3.70–5.45)
RDW: 13.2 % (ref 11.2–14.5)
WBC: 9.4 10*3/uL (ref 3.9–10.3)
lymph#: 1.3 10*3/uL (ref 0.9–3.3)

## 2015-07-06 NOTE — Progress Notes (Signed)
Subjective:     Patient ID: Kayla Johnston, female   DOB: August 20, 1965, 49 y.o.   MRN: [id number]  HPI   Review of Systems     Objective:   Physical Exam For the patient to understand and be given the tools to implement a healthy plant based diet during their cancer diagnosis.      Assessment:     Patient was seen today and found to be in good spirits and accompanied by her family. Pts ht 5'3'', wt 161, BMI 28.7. Pt had an interpreter for [place]. She had great questions and a very supportive family.      Plan:     Dietitian educated the patient on implementing a plant based diet by incorporating more plant proteins, fruits, and vegetables. As a part of a healthy routine physical activity was discussed. The importance of legitimate, evidence based information was discussed and examples were given. A folder of evidence based information with a focus on a plant based diet and general nutrition during cancer was given to the patient.  As a part of the continuum of care the cancer dietitian's contact information was given to the patient in the event they would like to have a follow up appointment.

## 2015-07-06 NOTE — Progress Notes (Signed)
[place] Social [facility]  Clinical Social Work was referred by medical oncologist to review and complete healthcare advance directives.  Clinical Social Worker met with patient and family in [place] office.  The patient designated [facility] as their primary healthcare agent.  Patient also completed healthcare living will.    Clinical Social Worker notarized documents and made copies for patient/family. Clinical Social Worker will send documents to medical records to be scanned into patient's chart. Clinical Social Worker encouraged patient/family to contact with any additional questions or concerns.  Johnnye Lana, MSW, LCSW, OSW-C Clinical Social Worker [facility] [phone]

## 2015-07-06 NOTE — Progress Notes (Signed)
Kayla Johnston is a very pleasant 49 y.o. female from [place], [place] with newly diagnosed invasive ductal carcinoma with DCIS of the right breast.  Biopsy results revealed the tumor's prognostic profile is ER positive, PR positive, and HER2/neu negative.   She presents today to the [facility]) for treatment consideration and recommendations from the breast surgeon, radiation oncologist, and medical oncologist.     I briefly met with Kayla Johnston during her [facility] visit today. We discussed the purpose of the Survivorship Clinic, which will include monitoring for recurrence, coordinating completion of age and gender-appropriate cancer screenings, promotion of overall wellness, as well as managing potential late/long-term side effects of anti-cancer treatments.    The treatment plan for Kayla Johnston will likely include surgery and anti-estrogen therapy.  As of today, the intent of treatment for Kayla Johnston is cure, therefore she will be eligible for the Survivorship Clinic upon her completion of treatment.  Her survivorship care plan (SCP) document will be drafted and updated throughout the course of her treatment trajectory. She will receive the SCP in an office visit with myself in the Survivorship Clinic once she has completed treatment.   Kayla Johnston was encouraged to ask questions and all questions were answered to her satisfaction.  She was given my business card and encouraged to contact me with any concerns regarding survivorship.  I look forward to participating in her care.   Kenn File, [place] [phone]

## 2015-07-06 NOTE — Therapy (Signed)
[place], [place], [address] Phone: [phone]   Fax:  [phone]  Physical Therapy Evaluation  Patient Details  Name: Kayla Johnston MRN: [id number] Date of Birth: 1965/09/13 Referring Provider: Dr. Erroll Luna  Encounter Date: 07/06/2015      PT End of Session - 07/06/15 2030    Visit Number 1   Number of Visits 1   PT Start Time 9753   PT Stop Time 0051  Also saw pt from 1145 - 1210   PT Time Calculation (min) 10 min   Activity Tolerance Patient tolerated treatment well   Behavior During Therapy [facility] for tasks assessed/performed      Past Medical History  Diagnosis Date  . Breast cancer ([place])   . Breast cancer of upper-outer quadrant of right female breast ([place]) 07/05/2015    Past Surgical History  Procedure Laterality Date  . Appendectomy    . Breast biopsy      There were no vitals filed for this visit.  Visit Diagnosis:  Breast cancer, female, right - Plan: PT plan of care cert/re-cert  Abnormal posture - Plan: PT plan of care cert/re-cert      Subjective Assessment - 07/06/15 2031    Pertinent History Patient was diagnosed on 06/29/15 with right ER/PR positive, HER2 negative breast cancer measuring 1.5 cm with a possible 6 cm of calcifications.            [facility] PT Assessment - 07/06/15 0001    Assessment   Medical Diagnosis Right breast cancer   Referring Provider Dr. Marcello Moores [place]   Onset Date/Surgical Date 06/29/15   Hand Dominance Right   Prior Therapy none   Precautions   Precautions Other (comment)   Precaution Comments Active breast cancer   Restrictions   Weight Bearing Restrictions No   Balance Screen   Has the patient fallen in the past 6 months Yes   How many times? 1  Slipped; not a balance issue   Has the patient had a decrease in activity level because of a fear of falling?  No   Is the patient reluctant to leave their home because of a  fear of falling?  No   Home Social worker Private residence   Living Arrangements Non-relatives/Friends   Available Help at Discharge Family   Prior Function   Level of Independence Independent   Vocation Unemployed   Vocation Requirements N/A   Leisure She does not exercise   Cognition   Overall Cognitive Status Within Functional Limits for tasks assessed   Posture/Postural Control   Posture/Postural Control Postural limitations   Postural Limitations Forward head;Rounded Shoulders   ROM / Strength   AROM / PROM / Strength AROM;Strength   AROM   AROM Assessment Site Shoulder   Right/Left Shoulder Right;Left   Right Shoulder Extension 46 Degrees   Right Shoulder Flexion 150 Degrees   Right Shoulder ABduction 166 Degrees   Right Shoulder Internal Rotation 71 Degrees   Right Shoulder External Rotation 81 Degrees   Left Shoulder Extension 52 Degrees   Left Shoulder Flexion 146 Degrees   Left Shoulder ABduction 155 Degrees   Left Shoulder Internal Rotation 70 Degrees   Left Shoulder External Rotation 78 Degrees   Strength   Overall Strength Within functional limits for tasks performed           LYMPHEDEMA/ONCOLOGY QUESTIONNAIRE - 07/06/15 2026    Type   Cancer Type Right breast cancer  Lymphedema Assessments   Lymphedema Assessments Upper extremities   Right Upper Extremity Lymphedema   10 cm Proximal to Olecranon Process 30.2 cm   Olecranon Process 24.8 cm   10 cm Proximal to Ulnar Styloid Process 24.3 cm   Just Proximal to Ulnar Styloid Process 15.9 cm   Across Hand at [employer] 18.2 cm   At [place] of 2nd Digit 6 cm   Left Upper Extremity Lymphedema   10 cm Proximal to Olecranon Process 30.3 cm   Olecranon Process 24.3 cm   10 cm Proximal to Ulnar Styloid Process 23.6 cm   Just Proximal to Ulnar Styloid Process 15.4 cm   Across Hand at [employer] 17.7 cm   At [place] of 2nd Digit 5.8 cm      Patient was instructed today in a home  exercise program today for post op shoulder range of motion. These included active assist shoulder flexion in sitting, scapular retraction, wall walking with shoulder abduction, and hands behind head external rotation.  She was encouraged to do these twice a day, holding 3 seconds and repeating 5 times when permitted by her physician.         PT Education - 07/06/15 2028    Education provided Yes   Education Details Lymphedema risk reduction and post op shoulder ROM HEP   Person(s) Educated Patient   Methods Explanation;Demonstration;Handout   Comprehension Returned demonstration;Verbalized understanding              Breast Clinic Goals - 07/06/15 2034    Patient will be able to verbalize understanding of pertinent lymphedema risk reduction practices relevant to her diagnosis specifically related to skin care.   Baseline No knowledge   Time 1   Period Days   Status Achieved   Patient will be able to return demonstrate and/or verbalize understanding of the post-op home exercise program related to regaining shoulder range of motion.   Baseline No knowledge   Time 1   Period Days   Status Achieved   Patient will be able to verbalize understanding of the importance of attending the postoperative After Breast Cancer Class for further lymphedema risk reduction education and therapeutic exercise.   Baseline No knowledge   Time 1   Period Days   Status Achieved              Plan - 07/06/15 2031    Clinical Impression Statement Patient was diagnosed on 06/29/15 with right ER/PR positive, HER2 negative breast cancer measuring 1.5 cm with a possible 6 cm of calcifications.  She is planning to have a bilateral mastectomy with a right sentinel node biopsy followed by anti-estrogen therapy.  She will benefit from post op PT to regain shoulder ROM.   Pt will benefit from skilled therapeutic intervention in order to improve on the following deficits Decreased strength;Decreased  knowledge of precautions;Pain;Impaired UE functional use;Decreased range of motion   Rehab Potential Excellent   Clinical Impairments Affecting Rehab Potential none   PT Frequency One time visit   PT Treatment/Interventions Therapeutic exercise;Patient/family education   PT Next Visit Plan No PT indicated until after surgery   Consulted and Agree with Plan of Care Patient;Family member/caregiver   Family Member Consulted boyfriend and niece     Patient will follow up at outpatient cancer rehab if needed following surgery.  If the patient requires physical therapy at that time, a specific plan will be dictated and sent to the referring physician for approval. The patient  was educated today on appropriate basic range of motion exercises to begin post operatively and the importance of attending the After Breast Cancer class following surgery.  Patient was educated today on lymphedema risk reduction practices as it pertains to recommendations that will benefit the patient immediately following surgery.  She verbalized good understanding.  No additional physical therapy is indicated at this time.       Problem List Patient Active Problem List   Diagnosis Date Noted  . Breast cancer of upper-outer quadrant of right female breast ([place]) 07/05/2015   Annia Friendly, PT 07/06/2015 8:37 PM  [place], [place], [address] Phone: [phone]   Fax:  [phone]  Name: Kayla Johnston MRN: [id number] Date of Birth: [birth year]

## 2015-07-06 NOTE — Progress Notes (Signed)
[place]  [phone] Fax:([phone]     ID: Kayla Johnston DOB: 10-10-1965  MR#: [id number]  OFB#:[id number]  Patient Care Team: Erroll Luna, MD as Consulting Physician (General Surgery) Chauncey Cruel, MD as Consulting Physician (Oncology) Thea Silversmith, MD as Consulting Physician (Radiation Oncology) [place] (Diagnostic Radiology) PCP: No primary care provider on file. GYN: OTHER MD:  CHIEF COMPLAINT:  Estrogen receptor positive breast cancer  CURRENT TREATMENT:  Awaiting definitive surgery   BREAST CANCER HISTORY:  the patient herself noted a new lump in the right breast sometime in September or October. She has no primary care physician , so it was not until December that she eventually brought it to medical attention.  Bilateral diagnostic mammography at [facility] 06/29/2015 with tomosynthesis from the breast composition to be category C. In the right breast upper outer quadrant there was an oval mass with indistinct margins which was palpable. There were also calcifications in the right breast at the 11:00 middle depth. There were no other mammographic findings. Ultrasonography of both breasts was performed the same day. In the left breast there was a 1 cm oval lesion at the 2:00 middle depth Ann a 1.3 cm lesion in the upper outer quadrant, both of which were felt to be probably benign but warranting repeat ultrasonography in 6 months.   In the right breast however there were multiple masses: a 1.1 cm irregular mass in the lower outer quadrant , a 0.6 cm irregular mass at the 8:00 middle depth , and a 3 cm lesion at the 11:00 middle depth , finally a benign-appearing 2.9 cm complex cyst in the right upper outer quadrant middle depth. There were internal septations.   On 06/30/2015 the patient underwent biopsy of a right breast upper outer quadrant mass as well as a biopsy of the calcifications in the right upper quadrant middle  depth. 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 This was estrogen receptor 100% positive, progesterone receptor 100% positive, with an MIB-1 of 25%, and no HER-2 amplification, the signals ratio being 1.33, and the number per cell 3.00. The area of calcifications was ductal carcinoma in situ, grade 2.   The patient's subsequent history is as detailed below  INTERVAL HISTORY: Kayla Johnston was evaluated in the multidisciplinary breast cancer clinic July 06 2015, accompanied by her significant other Kayla Johnston , and her niece Kayla Johnston. There also also a Patent attorney available. Her case was also presented in the multidisciplinary breast cancer conference that same morning. At that time a preliminary plan was proposed: obtaining an MRI, then an Oncotype , with consideration of radiation if the patient underwent breast conserving surgery.  Mastectomy was also brought up as an alternative. The patient was felt not to qualify for genetics unless she had at least one relative with breast or ovarian cancer.  REVIEW OF SYSTEMS: There were no specific symptoms leading to the original mammogram, which was routinely scheduled. The patient denies unusual headaches, visual changes, nausea, vomiting, stiff neck, dizziness, or gait imbalance. There has been no cough, phlegm production, or pleurisy, no chest pain or pressure, and no change in bowel or bladder habits. The patient denies fever, rash, bleeding, unexplained fatigue or unexplained weight loss.  The patient admits to mild insomnia changes, mild to moderate fatigue, seasonal allergies, and chronic low back pain which is not more intense or persistent than before.A detailed review of systems was otherwise entirely negative.  PAST MEDICAL HISTORY:  Past Medical History  Diagnosis Date  . Breast cancer ([place])   . Breast cancer of upper-outer quadrant of right female breast ([place]) 07/05/2015    PAST SURGICAL HISTORY: Past  Surgical History  Procedure Laterality Date  . Appendectomy    . Breast biopsy      FAMILY HISTORY Family History  Problem Relation Age of Onset  . Cancer Sister     cervical   the patient's father died at the age of 65 but the patient does not know the cause. The patient's mother is currently alive. She is 32 years old as of December 2016. The patient has 4 brothers and 4 sisters. There is no history of breast or ovarian cancer in the family to her knowledge  GYNECOLOGIC HISTORY:  Patient's last menstrual period was 06/11/2015.  menarche age 38. The patient is GX P0. She is still having regular periods, which last approximately 3-4 days and are scant. She is not having hot flashes or vaginal dryness problems. She is not interested in fertility preservation  SOCIAL HISTORY:   the patient is single.  She is not employed. She is not an Kayla Johnston speaker. Her significant other, [facility],  Is also at the home , as is his brother Building control surveyor.   They work at [employer] and odd jobs.    ADVANCED DIRECTIVES:  Not in place. At the initial clinic visit 07/08/2015 the patient was given the appropriate forms to complete and notarize at her discretion.  She tells me she intends to name Kayla Johnston as her healthcare power of attorney. He can be reached at [place]: Social History  Substance Use Topics  . Smoking status: Never Smoker   . Smokeless tobacco: Never Used  . Alcohol Use: No     Colonoscopy:  PAP: 2013?  Bone density:  Lipid panel:  No Known Allergies  Current Outpatient Prescriptions  Medication Sig Dispense Refill  . acetaminophen (TYLENOL) 500 MG tablet Take 1,000 mg by mouth every 6 (six) hours as needed for mild pain.    . [DISCONTINUED] cetirizine (ZYRTEC) 10 MG tablet Take 1 tablet (10 mg total) by mouth daily. (Patient not taking: Reported on 06/25/2015) 30 tablet 11  . [DISCONTINUED] ranitidine (ZANTAC) 150 MG tablet Take 1 tablet (150 mg  total) by mouth 2 (two) times daily. 60 tablet 0   No current facility-administered medications for this visit.    OBJECTIVE:  Middle-aged Spanish speaker who appears stated age  17 Vitals:   07/06/15 0938  BP: 132/75  Pulse: 70  Temp: 98.2 F (36.8 C)  Resp: 18     Body mass index is 28.65 kg/(m^2).    ECOG FS:1 - Symptomatic but completely ambulatory  Ocular: Sclerae unicteric, pupils equal, round and reactive to light Ear-nose-throat: Oropharynx clear and moist Lymphatic: No cervical or supraclavicular adenopathy Lungs no rales or rhonchi, good excursion bilaterally Heart regular rate and rhythm, no murmur appreciated Abd soft, nontender, positive bowel sounds MSK no focal spinal tenderness, no joint edema Neuro: non-focal, well-oriented, appropriate affect Breasts:  The right breast is status post recent biopsy. Overall the breast is rather lumpy. I do not palpate any right axillary adenopathy. The left breast is also lumpy. The left axilla is benign.   LAB RESULTS:  CMP     Component Value Date/Time   NA 139 07/06/2015 0926   K 3.7 07/06/2015 0926   CO2 23 07/06/2015 0926   GLUCOSE 117 07/06/2015 0926   BUN 10.2 07/06/2015  0926   CREATININE 0.8 07/06/2015 0926   CALCIUM 9.2 07/06/2015 0926   PROT 7.5 07/06/2015 0926   ALBUMIN 3.8 07/06/2015 0926   AST 16 07/06/2015 0926   ALT 18 07/06/2015 0926   ALKPHOS 77 07/06/2015 0926   BILITOT 0.65 07/06/2015 0926    INo results found for: SPEP, UPEP  Lab Results  Component Value Date   WBC 9.4 07/06/2015   NEUTROABS 7.5* 07/06/2015   HGB 13.5 07/06/2015   HCT 40.6 07/06/2015   MCV 88.0 07/06/2015   PLT 211 07/06/2015      Chemistry      Component Value Date/Time   NA 139 07/06/2015 0926   K 3.7 07/06/2015 0926   CO2 23 07/06/2015 0926   BUN 10.2 07/06/2015 0926   CREATININE 0.8 07/06/2015 0926      Component Value Date/Time   CALCIUM 9.2 07/06/2015 0926   ALKPHOS 77 07/06/2015 0926   AST 16  07/06/2015 0926   ALT 18 07/06/2015 0926   BILITOT 0.65 07/06/2015 0926       No results found for: LABCA2  No components found for: LABCA125  No results for input(s): INR in the last 168 hours.  Urinalysis    Component Value Date/Time   BILIRUBINUR neg 10/09/2011 1848   PROTEINUR trace 10/09/2011 1848   UROBILINOGEN 0.2 10/09/2011 1848   NITRITE neg 10/09/2011 1848   LEUKOCYTESUR Negative 10/09/2011 1848    STUDIES: Dg Chest 2 View  07/05/2015  CLINICAL DATA:  Acute onset of chills, cough, sore throat and fever. Initial encounter. EXAM: CHEST  2 VIEW COMPARISON:  None. FINDINGS: The lungs are well-aerated and clear. There is no evidence of focal opacification, pleural effusion or pneumothorax. The heart is normal in size; the mediastinal contour is within normal limits. No acute osseous abnormalities are seen. IMPRESSION: No acute cardiopulmonary process seen. Electronically Signed   By: Garald Balding M.D.   On: 07/05/2015 04:59    ASSESSMENT: 49 y.o.  [place] speaker status post biopsy of 2 areas in the right breast 06/30/2015, showing invasive ductal carcinoma , estrogen and progesterone receptor positive, HER-2 not amplified, with an MIB-1 of 25%, and ductal carcinoma in situ    (1) definitive surgery pending  (2)  Adjuvant chemotherapy as appropriate depending on surgical or oncotype results  (3) adjuvant radiation as appropriate   (4) antiestrogen therapy following completion of local therapy  PLAN: Ms. Unterreiner' situation is complex and we spent well over an hour discussing it. I oriented her to the meaning of cancer in general and breast cancer in particular. (When asked what she understands by "cancer", she said "death.") We also discussed the fact that her breast cancer is estrogen "eating" but not dependent on anti-HER-2. She understands she will benefit greatly from anti-estrogen pills, but not at all from anti-HER-2 immunotherapy.  The question  of chemotherapy will be resolved with an Oncotype. This will be sent from the definitive sample.  The real issue for Sharnell is local treatment. She understands there is no survival difference between lumpectomy plus radiation as compared with mastectomy. This assumes however that the patient will have routine follow-up including yearly mammography and physician visits. In short it assumes a level of medical care that this patient has not had in the past and may not have in the future.  For that reason she is interested in bilateral mastectomies. This will likely obviate the need for radiation, obviate the need for future mammography or other studies,  and will reduce the risk of local recurrence to near 0. She understands unfortunately her insurance will not provide for reconstruction.  However we  shouldbe able to assist her in getting bilateral breast  Prostheses.  After much discussion, then, the plan will be for bilateral mastectomies, with chemotherapy depending on the Oncotype result. In any case she will benefit from 5-10 years of antiestrogen once her local treatment has been completed  Roselee has a good understanding of the overall plan. She agrees with it. She knows the goal of treatment in her case is cure. She will call with any problems that may develop before her next visit here.  Chauncey Cruel, MD   07/06/2015 1:34 PM Medical Oncology and Hematology [facility] [address] [place], [place] [address] Tel. [phone]    Fax. [phone]

## 2015-07-06 NOTE — Progress Notes (Signed)
Radiation Oncology         [phone]) [phone] ________________________________  Initial outpatient Consultation - Date: 07/06/2015   Name: Kayla Johnston MRN: [id number]   DOB: Apr 16, 1966  REFERRING PHYSICIAN: Erroll Luna, MD  DIAGNOSIS AND STAGE: T2 N0 invasive ductal carcinoma of the right breast.    HISTORY OF PRESENT ILLNESS::Kayla Johnston is a 49 y.o. female who presented to the ER with complaints of pain in the right breast. She had never had a mammogram. She was referred and was found to have at least 6 cm of microcalcifications in the upper outer quadrant as well as multiple areas of asymmetry. A mass was also seen which on ultrasound was a cyst. Two stereotactic biopsies were performed on the microcalcifications. These showed DCIS and one biopsy showed Grade II invasive ductal caricnoma ER+ PR+ HER-2(-), Ki-67 20%. The clips were 1.5 cm apart. An MRI has been scheduled for next Tuesday. She has been enrolled in the [facility] program. Family history includes a sister with cervical cancer.   Kayla Johnston does not speak English and is accompanied by her husband, her daughter, and a [place] translator who helped dictate our visit today. She has an appointment currently scheduled on Monday, 12/19 for an MRI.   PREVIOUS RADIATION THERAPY: No  Past medical, social and family history were reviewed in the electronic chart. Review of symptoms was reviewed in the electronic chart. Medications were reviewed in the electronic chart.   PHYSICAL EXAM:  Vitals with BMI 07/06/2015  Height _0   Weight 161 lbs 11 oz  BMI 61.9  Systolic 509  Diastolic 75  Pulse 70  Respirations 18   A breast exam was not performed today. She is alert and oriented x3. Pleasant and appears her stated age.  Gynecologic History  Age at first menstrual period? 13  Are you still having periods? Yes Approximate date of last period? 06/16/15  If you are still having periods: Are your periods  regular? Yes  If you no longer have periods: Have you used hormone replacement? No  If YES, for how long? N/A When did you stop? N/A Obstetric History:  How many children have you carried to term? No Your age at first live birth? N/A  Pregnant now or trying to get pregnant? No  Have you used birth control pills or hormone shots for contraception? No  If so, for how long (or approximate dates)? N/A  Would you be interested in learning more about the options to preserve fertility? N/A Health Maintenance:  Have you ever had a colonoscopy? No If yes, date? N/A  Have you ever had a bone density? No If yes, date? N/A  Date of your last PAP smear? 2 years ago Date of your FIRST mammogram? 06/29/15  IMPRESSION: T2 N0 invasive ductal carcinoma of the right breast.  PLAN: She has elected for bilateral mastectomies at this time. We discussed the role of radiation in both the post mastectomy and in the breast conservation settings. We discussed radiation in the mastectomy setting of patients who have invasive tumors more than 5 cm in size and positive lymph nodes benefit from adjuvant radiation. We discussed the process of simulation and the placement of tattoos. We discussed six weeks of treatment as an outpatient. We discussed the possible side effects of treatment including skin redness, fatigue, and damage to critical normal structures including the ribs, lung, and heart. She will undergo her MRI and then proceed to surgery. Oncotype testing has been recommended to determine  her need for chemotherapy. I will plan on seeing her back when her initial treatments are complete.   I spent 60 minutes  face to face with the patient and more than 50% of that time was spent in counseling and/or coordination of care.   ------------------------------------------------  Thea Silversmith, MD  This document serves as a record of services personally performed by Thea Silversmith, MD. It was created on her behalf by  Arlyce Harman, a trained medical scribe. The creation of this record is based on the scribe's personal observations and the provider's statements to them. This document has been checked and approved by the attending provider.

## 2015-07-06 NOTE — Patient Instructions (Signed)

## 2015-07-06 NOTE — H&P (Signed)
Kayla Johnston 07/06/2015 8:01 AM Location: [facility] Patient #: [id number] DOB: 12-13-1965 Married / Language: Spanish / Race: White Female  History of Present Illness Kayla Moores A. Kayla [name] MD; 07/06/2015 11:38 AM) Patient words: PATIENT SENT AT THE REQUEST OF [facility]. [facility] FOR RIGHT BREAST CANCER. This was detected on screening mammogram. A 1 cm central mass noted core bight. 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 This is er pr positive. Positive HER-2/neu negative. She speaks very little english accompanied by her family and a Optometrist today provided by [employer]. Patient denies anyproblems in either breast.(pain, nipple discharge or change in either breast.) Denies any family history.  The patient is a 49 year old female.   Other Problems Kayla Slipper, RN; 07/06/2015 8:01 AM) Gastroesophageal Reflux Disease Lump In Breast  Past Surgical History Kayla Slipper, RN; 07/06/2015 8:01 AM) Appendectomy  Diagnostic Studies History Kayla Slipper, RN; 07/06/2015 8:01 AM) Colonoscopy never Mammogram within last year Pap Smear 1-5 years ago  Medication History Kayla Slipper, RN; 07/06/2015 8:02 AM) No Current Medications Medications Reconciled  Social History Kayla Slipper, RN; 07/06/2015 8:01 AM) Caffeine use Carbonated beverages, Coffee. No alcohol use No drug use Tobacco use Never smoker.  Family History Kayla Slipper, RN; 07/06/2015 8:01 AM) Hypertension Mother.  Pregnancy / Birth History Kayla Slipper, RN; 07/06/2015 8:01 AM) Age at menarche 27 years. Gravida 2 Maternal age 31-35 Para 0 Regular periods     Review of Systems Kayla Slipper RN; 07/06/2015 8:01 AM) General Present- Fatigue and Weight Loss. Not Present- Appetite Loss, Chills, Fever, Night Sweats and Weight Gain. Skin Not Present- Change in Wart/Mole, Dryness, Hives, Jaundice, New Lesions, Non-Healing Wounds, Rash and Ulcer. HEENT Present- Sore Throat and Wears  glasses/contact lenses. Not Present- Earache, Hearing Loss, Hoarseness, Nose Bleed, Oral Ulcers, Ringing in the Ears, Seasonal Allergies, Sinus Pain, Visual Disturbances and Yellow Eyes. Breast Present- Breast Pain. Not Present- Breast Mass, Nipple Discharge and Skin Changes. Cardiovascular Not Present- Chest Pain, Difficulty Breathing Lying Down, Leg Cramps, Palpitations, Rapid Heart Rate, Shortness of Breath and Swelling of Extremities. Gastrointestinal Not Present- Abdominal Pain, Bloating, Bloody Stool, Change in Bowel Habits, Chronic diarrhea, Constipation, Difficulty Swallowing, Excessive gas, Gets full quickly at meals, Hemorrhoids, Indigestion, Nausea, Rectal Pain and Vomiting. Female Genitourinary Not Present- Frequency, Nocturia, Painful Urination, Pelvic Pain and Urgency. Musculoskeletal Present- Back Pain. Not Present- Joint Pain, Joint Stiffness, Muscle Pain, Muscle Weakness and Swelling of Extremities. Neurological Present- Numbness. Not Present- Decreased Memory, Fainting, Headaches, Seizures, Tingling, Tremor, Trouble walking and Weakness. Psychiatric Present- Change in Sleep Pattern. Not Present- Anxiety, Bipolar, Depression, Fearful and Frequent crying. Endocrine Not Present- Cold Intolerance, Excessive Hunger, Hair Changes, Heat Intolerance, Hot flashes and New Diabetes. Hematology Present- Easy Bruising. Not Present- Excessive bleeding, Gland problems, HIV and Persistent Infections.   Physical Exam (Kayla Johnston A. Kayla Brannick MD; 07/06/2015 11:35 AM)  General Mental Status-Alert. General Appearance-Consistent with stated age. Hydration-Well hydrated. Voice-Normal.  Head and Neck Head-normocephalic, atraumatic with no lesions or palpable masses. Trachea-midline. Thyroid Gland Characteristics - normal size and consistency.  Eye Eyeball - Bilateral-Extraocular movements intact. Sclera/Conjunctiva - Bilateral-No scleral icterus.  Chest and Lung Exam Chest and  lung exam reveals -quiet, even and easy respiratory effort with no use of accessory muscles and on auscultation, normal breath sounds, no adventitious sounds and normal vocal resonance. Inspection Chest Wall - Normal. Back - normal.  Breast Breast - Left-Symmetric, Non Tender, No Biopsy scars, no Dimpling, No Inflammation, No Lumpectomy scars, No Mastectomy scars, No Peau  d' Orange. Breast - Right-Symmetric, Non Tender, No Biopsy scars, no Dimpling, No Inflammation, No Lumpectomy scars, No Mastectomy scars, No Peau d' Orange. Breast Lump-No Palpable Breast Mass. Note: dense left breast tissue  Cardiovascular Cardiovascular examination reveals -normal heart sounds, regular rate and rhythm with no murmurs and normal pedal pulses bilaterally.  Abdomen Inspection Inspection of the abdomen reveals - No Hernias. Skin - Scar - no surgical scars. Palpation/Percussion Palpation and Percussion of the abdomen reveal - Soft, Non Tender, No Rebound tenderness, No Rigidity (guarding) and No hepatosplenomegaly. Auscultation Auscultation of the abdomen reveals - Bowel sounds normal.  Neurologic Neurologic evaluation reveals -alert and oriented x 3 with no impairment of recent or remote memory. Mental Status-Normal.  Musculoskeletal Normal Exam - Left-Upper Extremity Strength Normal and Lower Extremity Strength Normal. Normal Exam - Right-Upper Extremity Strength Normal and Lower Extremity Strength Normal.  Lymphatic Head & Neck  General Head & Neck Lymphatics: Bilateral - Description - Normal. Axillary  General Axillary Region: Bilateral - Description - Normal. Tenderness - Non Tender. Femoral & Inguinal - Did not examine.    Assessment & Plan (Kayla Deans A. Kayla Carlile MD; 07/06/2015 11:38 AM)  STAGE I BREAST CANCER, RIGHT (C50.911) Impression: discussed right simple mastectomy.patient has decided on bilateral simple mastectomy.. Recommend right axillary sentinel lymph node  mapping as well. Discussed reconstion but she is unable to do that at this time. Discussed treatment options for breast cancer to include breast conservation vs mastectomy with reconstruction. Pt has decided on mastectomy. Risk include bleeding, infection, flap necrosis, pain, numbness, recurrence, hematoma, other surgery needs. Pt understands and agrees to proceed. Risk of sentinel lymph node mapping include bleeding, infection, lymphedema, shoulder pain. stiffness, dye allergy. cosmetic deformity , blood clots, death, need for more surgery. Pt agres to proceed.

## 2015-07-07 ENCOUNTER — Telehealth: Payer: Self-pay | Admitting: Oncology

## 2015-07-07 NOTE — Telephone Encounter (Signed)
lvm for Almyra Free to call pt and advised on Jan 2017 appt

## 2015-07-08 LAB — CULTURE, GROUP A STREP: Strep A Culture: NEGATIVE

## 2015-07-11 ENCOUNTER — Inpatient Hospital Stay: Admission: RE | Admit: 2015-07-11 | Payer: Self-pay | Source: Ambulatory Visit

## 2015-07-11 ENCOUNTER — Telehealth: Payer: Self-pay | Admitting: *Deleted

## 2015-07-11 NOTE — Telephone Encounter (Signed)
Spoke with patient's niece about patient from [facility] 07/06/15.  They are anxiously awaiting a surgery date.  She states she called [facility] to inquire about the surgery date and was told they could not give her information.  She is listed with [place] as a contact.  Informed her I would contact [facility] to find out more information.

## 2015-07-12 ENCOUNTER — Encounter: Payer: Self-pay | Admitting: *Deleted

## 2015-07-12 NOTE — Progress Notes (Signed)
Clinical Social Work [place] Psychosocial Distress Screening [place]  Patient completed distress screening protocol and scored a 0 on the Psychosocial Distress Thermometer which indicates no distress. Clinical Social Worker met with patient, patients family, and spanish interpreter in [facility] to assess for distress and other psychosocial needs. Patient stated she was doing "ok" and felt comfortable with her treatment plan and treatment team.CSW and patient discussed common feeling and emotions when being diagnosed with cancer, and the importance of support during treatment. CSW informed patient of the support team and support services at [facility]. CSW provided contact information and encouraged patient to call with any questions or concerns.  [facility] DISTRESS SCREENING 07/12/2015  Screening Type Initial Screening  Distress experienced in past week (1-10) 0   Johnnye Lana, MSW, LCSW, OSW-C Clinical Social Worker [place] [phone]

## 2015-07-13 ENCOUNTER — Encounter (HOSPITAL_COMMUNITY): Payer: Self-pay | Admitting: *Deleted

## 2015-07-14 ENCOUNTER — Ambulatory Visit: Payer: Self-pay | Admitting: Surgery

## 2015-07-14 DIAGNOSIS — C50911 Malignant neoplasm of unspecified site of right female breast: Secondary | ICD-10-CM

## 2015-07-15 ENCOUNTER — Telehealth: Payer: Self-pay | Admitting: Oncology

## 2015-07-15 ENCOUNTER — Encounter: Payer: Self-pay | Admitting: *Deleted

## 2015-07-15 NOTE — Telephone Encounter (Signed)
Appointments made and a new calendar has been mailed to the patient

## 2015-08-15 ENCOUNTER — Encounter (HOSPITAL_COMMUNITY)
Admission: RE | Admit: 2015-08-15 | Discharge: 2015-08-15 | Disposition: A | Discharge status: Home / Self Care | Payer: Self-pay | Source: Ambulatory Visit | Attending: Surgery | Admitting: Surgery

## 2015-08-15 ENCOUNTER — Encounter (HOSPITAL_COMMUNITY): Payer: Self-pay

## 2015-08-15 VITALS — BP 137/56 | HR 61 | Temp 98.2°F | Resp 20 | Ht 60.0 in | Wt 163.5 lb

## 2015-08-15 DIAGNOSIS — Z01812 Encounter for preprocedural laboratory examination: Secondary | ICD-10-CM | POA: Insufficient documentation

## 2015-08-15 DIAGNOSIS — C50911 Malignant neoplasm of unspecified site of right female breast: Secondary | ICD-10-CM | POA: Insufficient documentation

## 2015-08-15 LAB — CBC WITH DIFFERENTIAL/PLATELET
BASOS ABS: 0 10*3/uL (ref 0.0–0.1)
Basophils Relative: 0 %
EOS PCT: 1 %
Eosinophils Absolute: 0.1 10*3/uL (ref 0.0–0.7)
HEMATOCRIT: 41.4 % (ref 36.0–46.0)
Hemoglobin: 14 g/dL (ref 12.0–15.0)
LYMPHS ABS: 2.3 10*3/uL (ref 0.7–4.0)
LYMPHS PCT: 45 %
MCH: 30 pg (ref 26.0–34.0)
MCHC: 33.8 g/dL (ref 30.0–36.0)
MCV: 88.7 fL (ref 78.0–100.0)
MONO ABS: 0.2 10*3/uL (ref 0.1–1.0)
Monocytes Relative: 4 %
NEUTROS ABS: 2.5 10*3/uL (ref 1.7–7.7)
Neutrophils Relative %: 50 %
PLATELETS: 239 10*3/uL (ref 150–400)
RBC: 4.67 MIL/uL (ref 3.87–5.11)
RDW: 12.9 % (ref 11.5–15.5)
WBC: 5 10*3/uL (ref 4.0–10.5)

## 2015-08-15 LAB — COMPREHENSIVE METABOLIC PANEL
ALK PHOS: 65 U/L (ref 38–126)
ALT: 37 U/L (ref 14–54)
AST: 27 U/L (ref 15–41)
Albumin: 3.9 g/dL (ref 3.5–5.0)
Anion gap: 9 (ref 5–15)
BILIRUBIN TOTAL: 0.6 mg/dL (ref 0.3–1.2)
BUN: 8 mg/dL (ref 6–20)
CALCIUM: 9.5 mg/dL (ref 8.9–10.3)
CHLORIDE: 106 mmol/L (ref 101–111)
CO2: 26 mmol/L (ref 22–32)
CREATININE: 0.66 mg/dL (ref 0.44–1.00)
Glucose, Bld: 103 mg/dL — ABNORMAL HIGH (ref 65–99)
Potassium: 4.2 mmol/L (ref 3.5–5.1)
Sodium: 141 mmol/L (ref 135–145)
TOTAL PROTEIN: 6.9 g/dL (ref 6.5–8.1)

## 2015-08-15 LAB — HCG, SERUM, QUALITATIVE: PREG SERUM: NEGATIVE

## 2015-08-15 NOTE — Pre-Procedure Instructions (Signed)
Kayla Johnston  08/15/2015      [facility] - [place] ([place]), [place] [id number] [place] ([place]) [place] [address] Phone: [phone] Fax: [phone]    Your procedure is scheduled on Tues, Jan 31 @ 11:00 AM  Report to [facility] Admitting at 9:00 AM  Call this number if you have problems the morning of surgery:  [phone]   Remember:  Do not eat food or drink liquids after midnight.               No Goody's,BC's,Aleve,Aspirin,Ibuprofen,Motrin,Advil,Fish Oil,or any Herbal Medications.    Do not wear jewelry, make-up or nail polish.  Do not wear lotions, powders, or perfumes.  You may wear deodorant.  Do not shave 48 hours prior to surgery.   Do not bring valuables to the hospital.  [facility] is not responsible for any belongings or valuables.  Contacts, dentures or bridgework may not be worn into surgery.  Leave your suitcase in the car.  After surgery it may be brought to your room.  For patients admitted to the hospital, discharge time will be determined by your treatment team.  Patients discharged the day of surgery will not be allowed to drive home.   Special instructions:  [facility] - Preparing for Surgery  Before surgery, you can play an important role.  Because skin is not sterile, your skin needs to be as free of germs as possible.  You can reduce the number of germs on you skin by washing with CHG (chlorahexidine gluconate) soap before surgery.  CHG is an antiseptic cleaner which kills germs and bonds with the skin to continue killing germs even after washing.  Please DO NOT use if you have an allergy to CHG or antibacterial soaps.  If your skin becomes reddened/irritated stop using the CHG and inform your nurse when you arrive at Short Stay.  Do not shave (including legs and underarms) for at least 48 hours prior to the first CHG shower.  You may shave your face.  Please follow these instructions  carefully:   1.  Shower with CHG Soap the night before surgery and the                                morning of Surgery.  2.  If you choose to wash your hair, wash your hair first as usual with your       normal shampoo.  3.  After you shampoo, rinse your hair and body thoroughly to remove the                      Shampoo.  4.  Use CHG as you would any other liquid soap.  You can apply chg directly       to the skin and wash gently with scrungie or a clean washcloth.  5.  Apply the CHG Soap to your body ONLY FROM THE NECK DOWN.        Do not use on open wounds or open sores.  Avoid contact with your eyes,       ears, mouth and genitals (private parts).  Wash genitals (private parts)       with your normal soap.  6.  Wash thoroughly, paying special attention to the area where your surgery        will be performed.  7.  Thoroughly rinse your body with warm water from the neck down.  8.  DO NOT shower/wash with your normal soap after using and rinsing off       the CHG Soap.  9.  Pat yourself dry with a clean towel.            10.  Wear clean pajamas.            11.  Place clean sheets on your bed the night of your first shower and do not        sleep with pets.  Day of Surgery  Do not apply any lotions/deoderants the morning of surgery.  Please wear clean clothes to the hospital/surgery center.    Please read over the following fact sheets that you were given. Pain Booklet, Coughing and Deep Breathing and Surgical Site Infection Prevention

## 2015-08-15 NOTE — Progress Notes (Signed)
At PAT visit pt states both breast to be removed.Called and spoke with Jearld Fenton at Dr.Cornett's office who will have him put a new order in.To sign consent DOS

## 2015-08-15 NOTE — Progress Notes (Addendum)
Cardiologist denies  Medical Md doesn't have one  Echo denies  Stress test denies  Heart cath denies  Denies EKG in past yr  CXR in epic from 07-05-15

## 2015-08-15 NOTE — Progress Notes (Signed)
Kayla Johnston from Dr.Cornett's office called and states that consent order in epic is correct bc pt couldn't come up with money for bilateral.

## 2015-08-18 ENCOUNTER — Ambulatory Visit: Payer: Self-pay | Admitting: Oncology

## 2015-08-18 ENCOUNTER — Other Ambulatory Visit: Payer: Self-pay

## 2015-08-22 MED ORDER — DEXTROSE 5 % IV SOLN
3.0000 g | INTRAVENOUS | Status: AC
  Administered 2015-08-23: 2 g via INTRAVENOUS
  Filled 2015-08-22: qty 3000

## 2015-08-23 ENCOUNTER — Observation Stay (HOSPITAL_COMMUNITY)
Admission: RE | Admit: 2015-08-23 | Discharge: 2015-08-24 | Disposition: A | Discharge status: Home / Self Care | Payer: Self-pay | Source: Ambulatory Visit | Attending: Surgery | Admitting: Surgery

## 2015-08-23 ENCOUNTER — Ambulatory Visit (HOSPITAL_COMMUNITY): Payer: Self-pay | Admitting: Certified Registered Nurse Anesthetist

## 2015-08-23 ENCOUNTER — Ambulatory Visit (HOSPITAL_COMMUNITY)
Admission: RE | Admit: 2015-08-23 | Discharge: 2015-08-23 | Disposition: A | Discharge status: Home / Self Care | Payer: Self-pay | Source: Ambulatory Visit | Attending: Surgery | Admitting: Surgery

## 2015-08-23 ENCOUNTER — Encounter (HOSPITAL_COMMUNITY): Payer: Self-pay | Admitting: Certified Registered Nurse Anesthetist

## 2015-08-23 ENCOUNTER — Encounter (HOSPITAL_COMMUNITY)
Admission: RE | Disposition: A | Discharge status: Home / Self Care | Payer: Self-pay | Source: Ambulatory Visit | Attending: Surgery

## 2015-08-23 DIAGNOSIS — C50911 Malignant neoplasm of unspecified site of right female breast: Principal | ICD-10-CM | POA: Diagnosis present

## 2015-08-23 HISTORY — PX: SIMPLE MASTECTOMY W/ SENTINEL NODE BIOPSY: SHX2410

## 2015-08-23 HISTORY — PX: SIMPLE MASTECTOMY: SHX6097

## 2015-08-23 HISTORY — PX: SIMPLE MASTECTOMY WITH AXILLARY SENTINEL NODE BIOPSY: SHX6098

## 2015-08-23 LAB — CBC
HEMATOCRIT: 35 % — AB (ref 36.0–46.0)
HEMOGLOBIN: 11.7 g/dL — AB (ref 12.0–15.0)
MCH: 29.5 pg (ref 26.0–34.0)
MCHC: 33.4 g/dL (ref 30.0–36.0)
MCV: 88.4 fL (ref 78.0–100.0)
Platelets: 210 10*3/uL (ref 150–400)
RBC: 3.96 MIL/uL (ref 3.87–5.11)
RDW: 12.9 % (ref 11.5–15.5)
WBC: 14.3 10*3/uL — ABNORMAL HIGH (ref 4.0–10.5)

## 2015-08-23 LAB — CREATININE, SERUM
Creatinine, Ser: 0.62 mg/dL (ref 0.44–1.00)
GFR calc Af Amer: >60 mL/min (ref >60)

## 2015-08-23 SURGERY — SIMPLE MASTECTOMY WITH AXILLARY SENTINEL NODE BIOPSY
Anesthesia: General | Laterality: Right

## 2015-08-23 MED ORDER — HYDROMORPHONE HCL 1 MG/ML IJ SOLN
1.0000 mg | INTRAMUSCULAR | Status: DC | PRN
  Administered 2015-08-23 – 2015-08-24 (×2): 1 mg via INTRAVENOUS
  Filled 2015-08-23 (×2): qty 1

## 2015-08-23 MED ORDER — KETOROLAC TROMETHAMINE 30 MG/ML IJ SOLN
INTRAMUSCULAR | Status: AC
  Filled 2015-08-23: qty 1

## 2015-08-23 MED ORDER — EPHEDRINE SULFATE 50 MG/ML IJ SOLN
INTRAMUSCULAR | Status: AC
  Filled 2015-08-23: qty 1

## 2015-08-23 MED ORDER — ZOLPIDEM TARTRATE 5 MG PO TABS: 5.0000 mg | ORAL_TABLET | Freq: Every evening | ORAL | Status: DC | PRN

## 2015-08-23 MED ORDER — GLYCOPYRROLATE 0.2 MG/ML IJ SOLN
INTRAMUSCULAR | Status: AC
  Filled 2015-08-23: qty 3

## 2015-08-23 MED ORDER — METHOCARBAMOL 500 MG PO TABS
500.0000 mg | ORAL_TABLET | Freq: Four times a day (QID) | ORAL | Status: DC | PRN
  Filled 2015-08-23: qty 1

## 2015-08-23 MED ORDER — LIDOCAINE HCL (CARDIAC) 20 MG/ML IV SOLN
INTRAVENOUS | Status: AC
  Filled 2015-08-23: qty 5

## 2015-08-23 MED ORDER — 0.9 % SODIUM CHLORIDE (POUR BTL) OPTIME
TOPICAL | Status: DC | PRN
  Administered 2015-08-23: 1000 mL

## 2015-08-23 MED ORDER — ONDANSETRON HCL 4 MG/2ML IJ SOLN
4.0000 mg | Freq: Four times a day (QID) | INTRAMUSCULAR | Status: DC | PRN
  Administered 2015-08-23 – 2015-08-24 (×2): 4 mg via INTRAVENOUS
  Filled 2015-08-23 (×2): qty 2

## 2015-08-23 MED ORDER — ROCURONIUM BROMIDE 50 MG/5ML IV SOLN
INTRAVENOUS | Status: AC
  Filled 2015-08-23: qty 1

## 2015-08-23 MED ORDER — LACTATED RINGERS IV SOLN
INTRAVENOUS | Status: DC | PRN
  Administered 2015-08-23 (×2): via INTRAVENOUS

## 2015-08-23 MED ORDER — KCL IN DEXTROSE-NACL 20-5-0.9 MEQ/L-%-% IV SOLN
INTRAVENOUS | Status: DC
  Administered 2015-08-23: 20:00:00 via INTRAVENOUS
  Filled 2015-08-23 (×3): qty 1000

## 2015-08-23 MED ORDER — ONDANSETRON HCL 4 MG/2ML IJ SOLN
INTRAMUSCULAR | Status: DC | PRN
Start: 2015-08-23 — End: 2015-08-23
  Administered 2015-08-23: 4 mg via INTRAVENOUS

## 2015-08-23 MED ORDER — HYDRALAZINE HCL 20 MG/ML IJ SOLN: 10.0000 mg | INTRAMUSCULAR | Status: DC | PRN

## 2015-08-23 MED ORDER — TECHNETIUM TC 99M SULFUR COLLOID FILTERED
1.0000 | Freq: Once | INTRAVENOUS | Status: AC | PRN
  Administered 2015-08-23: 1 via INTRADERMAL

## 2015-08-23 MED ORDER — HYDROMORPHONE HCL 1 MG/ML IJ SOLN
INTRAMUSCULAR | Status: AC
  Filled 2015-08-23: qty 1

## 2015-08-23 MED ORDER — LIDOCAINE HCL (CARDIAC) 20 MG/ML IV SOLN
INTRAVENOUS | Status: DC | PRN
  Administered 2015-08-23: 80 mg via INTRAVENOUS

## 2015-08-23 MED ORDER — ROCURONIUM BROMIDE 100 MG/10ML IV SOLN
INTRAVENOUS | Status: DC | PRN
  Administered 2015-08-23: 40 mg via INTRAVENOUS
  Administered 2015-08-23: 10 mg via INTRAVENOUS

## 2015-08-23 MED ORDER — ONDANSETRON HCL 4 MG/2ML IJ SOLN
INTRAMUSCULAR | Status: AC
  Filled 2015-08-23: qty 2

## 2015-08-23 MED ORDER — KETOROLAC TROMETHAMINE 30 MG/ML IJ SOLN
30.0000 mg | Freq: Once | INTRAMUSCULAR | Status: AC | PRN
  Administered 2015-08-23: 30 mg via INTRAVENOUS

## 2015-08-23 MED ORDER — SODIUM CHLORIDE 0.9 % IJ SOLN
INTRAMUSCULAR | Status: AC
  Filled 2015-08-23: qty 10

## 2015-08-23 MED ORDER — PROPOFOL 10 MG/ML IV BOLUS
INTRAVENOUS | Status: DC | PRN
  Administered 2015-08-23: 160 mg via INTRAVENOUS

## 2015-08-23 MED ORDER — FENTANYL CITRATE (PF) 250 MCG/5ML IJ SOLN
INTRAMUSCULAR | Status: AC
  Filled 2015-08-23: qty 5

## 2015-08-23 MED ORDER — DEXAMETHASONE SODIUM PHOSPHATE 4 MG/ML IJ SOLN
INTRAMUSCULAR | Status: DC | PRN
  Administered 2015-08-23: 4 mg via INTRAVENOUS

## 2015-08-23 MED ORDER — PROMETHAZINE HCL 25 MG/ML IJ SOLN
6.2500 mg | INTRAMUSCULAR | Status: DC | PRN
Start: 2015-08-23 — End: 2015-08-23

## 2015-08-23 MED ORDER — HYDROMORPHONE HCL 1 MG/ML IJ SOLN
0.2500 mg | INTRAMUSCULAR | Status: DC | PRN
  Administered 2015-08-23 (×2): 0.5 mg via INTRAVENOUS

## 2015-08-23 MED ORDER — PHENYLEPHRINE HCL 10 MG/ML IJ SOLN
INTRAMUSCULAR | Status: AC
  Filled 2015-08-23: qty 1

## 2015-08-23 MED ORDER — ENOXAPARIN SODIUM 40 MG/0.4ML ~~LOC~~ SOLN
40.0000 mg | SUBCUTANEOUS | Status: DC
  Administered 2015-08-24: 40 mg via SUBCUTANEOUS
  Filled 2015-08-23: qty 0.4

## 2015-08-23 MED ORDER — PHENYLEPHRINE HCL 10 MG/ML IJ SOLN
INTRAMUSCULAR | Status: DC | PRN
  Administered 2015-08-23: 80 ug via INTRAVENOUS

## 2015-08-23 MED ORDER — OXYCODONE HCL 5 MG PO TABS: 5.0000 mg | ORAL_TABLET | ORAL | Status: DC | PRN

## 2015-08-23 MED ORDER — MIDAZOLAM HCL 2 MG/2ML IJ SOLN
INTRAMUSCULAR | Status: AC
  Administered 2015-08-23: 1 mg via INTRAVENOUS
  Filled 2015-08-23: qty 2

## 2015-08-23 MED ORDER — PROPOFOL 10 MG/ML IV BOLUS
INTRAVENOUS | Status: AC
  Filled 2015-08-23: qty 20

## 2015-08-23 MED ORDER — EPHEDRINE SULFATE 50 MG/ML IJ SOLN
INTRAMUSCULAR | Status: DC | PRN
  Administered 2015-08-23 (×2): 5 mg via INTRAVENOUS

## 2015-08-23 MED ORDER — LACTATED RINGERS IV SOLN
INTRAVENOUS | Status: DC
  Administered 2015-08-23: 10:00:00 via INTRAVENOUS

## 2015-08-23 MED ORDER — GLYCOPYRROLATE 0.2 MG/ML IJ SOLN
INTRAMUSCULAR | Status: DC | PRN
  Administered 2015-08-23: .6 mg via INTRAVENOUS

## 2015-08-23 MED ORDER — ONDANSETRON 4 MG PO TBDP: 4.0000 mg | ORAL_TABLET | Freq: Four times a day (QID) | ORAL | Status: DC | PRN

## 2015-08-23 MED ORDER — NEOSTIGMINE METHYLSULFATE 10 MG/10ML IV SOLN
INTRAVENOUS | Status: DC | PRN
  Administered 2015-08-23: 4 mg via INTRAVENOUS

## 2015-08-23 MED ORDER — MIDAZOLAM HCL 2 MG/2ML IJ SOLN
INTRAMUSCULAR | Status: AC
  Filled 2015-08-23: qty 2

## 2015-08-23 MED ORDER — FENTANYL CITRATE (PF) 100 MCG/2ML IJ SOLN
INTRAMUSCULAR | Status: AC
  Administered 2015-08-23: 50 ug via INTRAVENOUS
  Filled 2015-08-23: qty 2

## 2015-08-23 MED ORDER — NEOSTIGMINE METHYLSULFATE 10 MG/10ML IV SOLN
INTRAVENOUS | Status: AC
  Filled 2015-08-23: qty 1

## 2015-08-23 MED ORDER — FENTANYL CITRATE (PF) 100 MCG/2ML IJ SOLN
INTRAMUSCULAR | Status: DC | PRN
  Administered 2015-08-23 (×5): 50 ug via INTRAVENOUS

## 2015-08-23 SURGICAL SUPPLY — 54 items
APPLIER CLIP 9.375 MED OPEN (MISCELLANEOUS) ×8
APR CLP MED 9.3 20 MLT OPN (MISCELLANEOUS) ×4
BINDER BREAST LRG (GAUZE/BANDAGES/DRESSINGS) ×2 IMPLANT
BINDER BREAST XLRG (GAUZE/BANDAGES/DRESSINGS) IMPLANT
CANISTER SUCTION 2500CC (MISCELLANEOUS) ×4 IMPLANT
CHLORAPREP W/TINT 26ML (MISCELLANEOUS) ×4 IMPLANT
CLIP APPLIE 9.375 MED OPEN (MISCELLANEOUS) ×2 IMPLANT
CONT SPEC 4OZ CLIKSEAL STRL BL (MISCELLANEOUS) ×4 IMPLANT
COVER PROBE W GEL 5X96 (DRAPES) ×4 IMPLANT
COVER SURGICAL LIGHT HANDLE (MISCELLANEOUS) ×4 IMPLANT
DEVICE DISSECT PLASMABLAD 3.0S (MISCELLANEOUS) ×2 IMPLANT
DRAIN CHANNEL 19F RND (DRAIN) ×4 IMPLANT
DRAPE CHEST BREAST 15X10 FENES (DRAPES) ×4 IMPLANT
DRAPE LAPAROSCOPIC ABDOMINAL (DRAPES) ×4 IMPLANT
DRAPE UTILITY XL STRL (DRAPES) ×8 IMPLANT
DRSG PAD ABDOMINAL 8X10 ST (GAUZE/BANDAGES/DRESSINGS) ×2 IMPLANT
ELECT CAUTERY BLADE 6.4 (BLADE) ×4 IMPLANT
ELECT REM PT RETURN 9FT ADLT (ELECTROSURGICAL) ×4
ELECTRODE REM PT RTRN 9FT ADLT (ELECTROSURGICAL) ×2 IMPLANT
EVACUATOR SILICONE 100CC (DRAIN) ×4 IMPLANT
GAUZE SPONGE 4X4 12PLY STRL (GAUZE/BANDAGES/DRESSINGS) ×4 IMPLANT
GLOVE BIO SURGEON STRL SZ 6 (GLOVE) ×2 IMPLANT
GLOVE BIO SURGEON STRL SZ8 (GLOVE) ×4 IMPLANT
GLOVE BIOGEL PI IND STRL 7.5 (GLOVE) IMPLANT
GLOVE BIOGEL PI IND STRL 8 (GLOVE) ×2 IMPLANT
GLOVE BIOGEL PI INDICATOR 7.5 (GLOVE) ×2
GLOVE BIOGEL PI INDICATOR 8 (GLOVE) ×2
GLOVE ECLIPSE 7.5 STRL STRAW (GLOVE) ×2 IMPLANT
GOWN STRL REUS W/ TWL LRG LVL3 (GOWN DISPOSABLE) ×6 IMPLANT
GOWN STRL REUS W/ TWL XL LVL3 (GOWN DISPOSABLE) ×2 IMPLANT
GOWN STRL REUS W/TWL LRG LVL3 (GOWN DISPOSABLE) ×12
GOWN STRL REUS W/TWL XL LVL3 (GOWN DISPOSABLE) ×4
KIT BASIN OR (CUSTOM PROCEDURE TRAY) ×4 IMPLANT
KIT ROOM TURNOVER OR (KITS) ×4 IMPLANT
LIQUID BAND (GAUZE/BANDAGES/DRESSINGS) ×4 IMPLANT
NDL 18GX1X1/2 (RX/OR ONLY) (NEEDLE) IMPLANT
NDL HYPO 25GX1X1/2 BEV (NEEDLE) IMPLANT
NEEDLE 18GX1X1/2 (RX/OR ONLY) (NEEDLE) IMPLANT
NEEDLE HYPO 25GX1X1/2 BEV (NEEDLE) IMPLANT
NS IRRIG 1000ML POUR BTL (IV SOLUTION) ×4 IMPLANT
PACK GENERAL/GYN (CUSTOM PROCEDURE TRAY) ×4 IMPLANT
PAD ARMBOARD 7.5X6 YLW CONV (MISCELLANEOUS) ×4 IMPLANT
PLASMABLADE 3.0S (MISCELLANEOUS)
SPECIMEN JAR LARGE (MISCELLANEOUS) ×4 IMPLANT
SPECIMEN JAR X LARGE (MISCELLANEOUS) ×2 IMPLANT
SPONGE LAP 18X18 X RAY DECT (DISPOSABLE) ×4 IMPLANT
SUT ETHILON 3 0 FSL (SUTURE) ×4 IMPLANT
SUT MNCRL AB 4-0 PS2 18 (SUTURE) ×8 IMPLANT
SUT VIC AB 3-0 SH 18 (SUTURE) ×4 IMPLANT
SYR CONTROL 10ML LL (SYRINGE) IMPLANT
TAPE CLOTH SURG 4X10 WHT LF (GAUZE/BANDAGES/DRESSINGS) ×2 IMPLANT
TOWEL OR 17X24 6PK STRL BLUE (TOWEL DISPOSABLE) ×4 IMPLANT
TOWEL OR 17X26 10 PK STRL BLUE (TOWEL DISPOSABLE) ×4 IMPLANT
YANKAUER SUCT BULB TIP NO VENT (SUCTIONS) ×2 IMPLANT

## 2015-08-23 NOTE — Op Note (Signed)
Preoperative diagnosis: Stage I right breast cancer  Postoperative diagnosis: Same  Procedure: Right simple mastectomy with sentinel lymph node mapping and prophylactic left simple mastectomy  Surgeon: Erroll Luna M.D.  Anesthesia: Gen. endotracheal anesthesia  EBL: 50 mL  Specimens bilateral breasts with right axillary sentinel lymph nodes  Drains: 19 round Blake drains one per side  Indications for procedure: The patient is a 50 year old female diagnosed with right breast cancer stage I. She was seen in the multidisciplinary group and after discussion of her surgical options which include breast conservation versus mastectomy, she opted for mastectomy. She desired a prophylactic left  mastectomy as well. She was counseled about the pros and cons of this was offered reconstruction. She chose to defer her reconstruction to a later time. The surgical and non surgical options have been discussed with the patient.  Risks of surgery include bleeding,  Infection,  Flap necrosis,  Tissue loss,  Chronic pain, death, Numbness,  And the need for additional procedures.  Reconstruction options also have been discussed with the patient as well.  The patient agrees to proceed. Discussed the risk of bilateral  mastectomy and the increase complication rated this as well.  Description of procedure: The patient was met in the holding area and questions were answered with her family at the bedside. She underwent injection of the right breast for sentinel lymph node mapping by nuclear medicine. She was taken back to the operating room and placed upon the OR table. After induction of general anesthesia, the upper chest was prepped and draped in a sterile fashion bilaterally. Timeout was done. The left breast was done first. Curvilinear incision was made above and below the nipple complex. Superior and inferior skin flaps were created as well as a lateral skin flap. The breast was then dissected off the pectoralis  fascia in a medial to lateral fashion and passed off the field. Hemostasis achieved. 19 round drain placed through separate stab incision secured with 2-0 nylon. Wound closed with 3-0 Vicryl and 4-0 Monocryl.  The right breast was then done. In a similar fashion curvilinear incisions were made above and below the nipple complex. Superior and inferior skin flaps were raised. The breast was then dissected off the chest wall in a medial to lateral fashion and passed off the field. Neoprobe was used and 3 sentinel nodes were identified and removed. Hemostasis was achieved. 19 round drain placed in a similar fashion through separate stab incision and secured with 2-0 nylon. Wound closed with 3-0 Vicryl and 4-0 Monocryl. Liquid adhesive applied a binder placed. All final counts are found to be correct sponge, needle is needles and instruments. Patient was in awoke, extubated taken recovery in satisfactory condition.

## 2015-08-23 NOTE — Progress Notes (Signed)
Pt expressed tightness in chest that lasts a few seconds and then goes away.  Daughter states that she has spoken of this over the last month and that she gets it maybe once a day and stated that it happened a couple of time over the past weekend.  Daughter feels it may be anxiety related. No previous history of heart disease. Vital signs stable (P-61 R-10 BP 142/72). EKG strip shows NSR. Dr Orene Desanctis notified and felt that it was probably anxiety related as well. Will continue to monitor pt.

## 2015-08-23 NOTE — Interval H&P Note (Signed)
History and Physical Interval Note:  08/23/2015 12:03 PM  Kayla Johnston  has presented today for surgery, with the diagnosis of right breast cancer  The various methods of treatment have been discussed with the patient and family. After consideration of risks, benefits and other options for treatment, the patient has consented to  Procedure(s): RIGHT SIMPLE MASTECTOMY WITH RIGHT SENTINEL LYMPH  NODE MAPPING (Right) Left SIMPLE MASTECTOMY,profolactic (Left) as a surgical intervention .  The patient's history has been reviewed, patient examined, no change in status, stable for surgery.  I have reviewed the patient's chart and labs.  Questions were answered to the patient's satisfaction.     Kayla Johnston A.

## 2015-08-23 NOTE — Anesthesia Postprocedure Evaluation (Signed)
Anesthesia Post Note  Patient: Kayla Johnston  Procedure(s) Performed: Procedure(s) (LRB): RIGHT SIMPLE MASTECTOMY WITH RIGHT SENTINEL LYMPH  NODE MAPPING (Right) Left SIMPLE MASTECTOMY,prophylactic (Left)  Patient location during evaluation: PACU Anesthesia Type: General Level of consciousness: sedated Pain management: pain level controlled Vital Signs Assessment: post-procedure vital signs reviewed and stable Respiratory status: spontaneous breathing and respiratory function stable Cardiovascular status: stable Anesthetic complications: no    Last Vitals:  Filed Vitals:   08/23/15 1542 08/23/15 1545  BP: 131/67   Pulse: 58 59  Temp:    Resp: 20 19    Last Pain:  Filed Vitals:   08/23/15 1546  PainSc: [place]

## 2015-08-23 NOTE — Transfer of Care (Signed)
Immediate Anesthesia Transfer of Care Note  Patient: Kayla Johnston  Procedure(s) Performed: Procedure(s): RIGHT SIMPLE MASTECTOMY WITH RIGHT SENTINEL LYMPH  NODE MAPPING (Right) Left SIMPLE MASTECTOMY,prophylactic (Left)  Patient Location: PACU  Anesthesia Type:General  Level of Consciousness: awake, alert , patient cooperative and obtunded  Airway & Oxygen Therapy: Patient Spontanous Breathing  Post-op Assessment: Report given to RN, Post -op Vital signs reviewed and stable and Patient moving all extremities X 4  Post vital signs: Reviewed and stable  Last Vitals:  Filed Vitals:   08/23/15 0929 08/23/15 1052  BP:  142/71  Pulse: 63   Temp: 36.3 C   Resp: 20 10    Complications: No apparent anesthesia complications

## 2015-08-23 NOTE — Anesthesia Procedure Notes (Signed)
Procedure Name: Intubation Date/Time: 08/23/2015 12:53 PM Performed by: Tressia Miners LEFFEW Pre-anesthesia Checklist: Patient identified, Patient being monitored, Timeout performed, Emergency Drugs available and Suction available Patient Re-evaluated:Patient Re-evaluated prior to inductionOxygen Delivery Method: Circle System Utilized Preoxygenation: Pre-oxygenation with 100% oxygen Intubation Type: IV induction Ventilation: Mask ventilation without difficulty and Oral airway inserted - appropriate to patient size Laryngoscope Size: Mac and 3 Grade View: Grade I Tube type: Oral Tube size: 7.0 mm Number of attempts: 1 Airway Equipment and Method: Stylet Placement Confirmation: ETT inserted through vocal cords under direct vision,  positive ETCO2 and breath sounds checked- equal and bilateral Secured at: 23 cm Tube secured with: Tape Dental Injury: Teeth and Oropharynx as per pre-operative assessment

## 2015-08-23 NOTE — H&P (Signed)
H&P   Kayla Johnston (MR# [id number])      H&P Info    Author Note Status Last Update User Last Update Date/Time   Erroll Luna, MD Signed Erroll Luna, MD     H&P    Expand All Collapse All   Kayla Johnston  Location: [facility] Patient #: [id number] DOB: [birth year] Married / Language: Spanish / Race: White Female  History of Present Illness  Patient words: PATIENT SENT AT THE REQUEST OF [facility]R. [facility] FOR RIGHT BREAST CANCER. This was detected on screening mammogram. A 1 cm central mass noted core bight. 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 This is er pr positive. Positive HER-2/neu negative. She speaks very little english accompanied by her family and a Optometrist today provided by [employer]. Patient denies anyproblems in either breast.(pain, nipple discharge or change in either breast.) Denies any family history.  The patient is a 50 year old female.   Other Problems  Gastroesophageal Reflux Disease Lump In Breast  Past Surgical History Appendectomy  Diagnostic Studies History  Colonoscopy never Mammogram within last year Pap Smear 1-5 years ago  Medication History  No Current Medications Medications Reconciled  Social History  Caffeine use Carbonated beverages, Coffee. No alcohol use No drug use Tobacco use Never smoker.  Family History  Hypertension Mother.  Pregnancy / Birth History Age at menarche 48 years. Gravida 2 Maternal age 37-35 Para 0 Regular periods     Review of Systems  General Present- Fatigue and Weight Loss. Not Present- Appetite Loss, Chills, Fever, Night Sweats and Weight Gain. Skin Not Present- Change in Wart/Mole, Dryness, Hives, Jaundice, New Lesions, Non-Healing Wounds, Rash and Ulcer. HEENT Present- Sore Throat and Wears glasses/contact lenses. Not Present- Earache, Hearing Loss, Hoarseness, Nose Bleed, Oral Ulcers, Ringing in the Ears, Seasonal Allergies, Sinus Pain,  Visual Disturbances and Yellow Eyes. Breast Present- Breast Pain. Not Present- Breast Mass, Nipple Discharge and Skin Changes. Cardiovascular Not Present- Chest Pain, Difficulty Breathing Lying Down, Leg Cramps, Palpitations, Rapid Heart Rate, Shortness of Breath and Swelling of Extremities. Gastrointestinal Not Present- Abdominal Pain, Bloating, Bloody Stool, Change in Bowel Habits, Chronic diarrhea, Constipation, Difficulty Swallowing, Excessive gas, Gets full quickly at meals, Hemorrhoids, Indigestion, Nausea, Rectal Pain and Vomiting. Female Genitourinary Not Present- Frequency, Nocturia, Painful Urination, Pelvic Pain and Urgency. Musculoskeletal Present- Back Pain. Not Present- Joint Pain, Joint Stiffness, Muscle Pain, Muscle Weakness and Swelling of Extremities. Neurological Present- Numbness. Not Present- Decreased Memory, Fainting, Headaches, Seizures, Tingling, Tremor, Trouble walking and Weakness. Psychiatric Present- Change in Sleep Pattern. Not Present- Anxiety, Bipolar, Depression, Fearful and Frequent crying. Endocrine Not Present- Cold Intolerance, Excessive Hunger, Hair Changes, Heat Intolerance, Hot flashes and New Diabetes. Hematology Present- Easy Bruising. Not Present- Excessive bleeding, Gland problems, HIV and Persistent Infections.   Physical Exam   General Mental Status-Alert. General Appearance-Consistent with stated age. Hydration-Well hydrated. Voice-Normal.  Head and Neck Head-normocephalic, atraumatic with no lesions or palpable masses. Trachea-midline. Thyroid Gland Characteristics - normal size and consistency.  Eye Eyeball - Bilateral-Extraocular movements intact. Sclera/Conjunctiva - Bilateral-No scleral icterus.  Chest and Lung Exam Chest and lung exam reveals -quiet, even and easy respiratory effort with no use of accessory muscles and on auscultation, normal breath sounds, no adventitious sounds and normal vocal  resonance. Inspection Chest Wall - Normal. Back - normal.  Breast Breast - Left-Symmetric, Non Tender, No Biopsy scars, no Dimpling, No Inflammation, No Lumpectomy scars, No Mastectomy scars, No Peau d' Orange. Breast - Right-Symmetric, Non Tender,  No Biopsy scars, no Dimpling, No Inflammation, No Lumpectomy scars, No Mastectomy scars, No Peau d' Orange. Breast Lump-No Palpable Breast Mass. Note: dense left breast tissue  Cardiovascular Cardiovascular examination reveals -normal heart sounds, regular rate and rhythm with no murmurs and normal pedal pulses bilaterally.  Abdomen Inspection Inspection of the abdomen reveals - No Hernias. Skin - Scar - no surgical scars. Palpation/Percussion Palpation and Percussion of the abdomen reveal - Soft, Non Tender, No Rebound tenderness, No Rigidity (guarding) and No hepatosplenomegaly. Auscultation Auscultation of the abdomen reveals - Bowel sounds normal.  Neurologic Neurologic evaluation reveals -alert and oriented x 3 with no impairment of recent or remote memory. Mental Status-Normal.  Musculoskeletal Normal Exam - Left-Upper Extremity Strength Normal and Lower Extremity Strength Normal. Normal Exam - Right-Upper Extremity Strength Normal and Lower Extremity Strength Normal.  Lymphatic Head & Neck  General Head & Neck Lymphatics: Bilateral - Description - Normal. Axillary  General Axillary Region: Bilateral - Description - Normal. Tenderness - Non Tender. Femoral & Inguinal - Did not examine.    Assessment & Plan   STAGE I BREAST CANCER, RIGHT (C50.911) Impression: discussed right simple mastectomy.patient has decided on bilateral simple mastectomy.. Recommend right axillary sentinel lymph node mapping as well. Discussed reconstruction but she is unable to do that at this time. Discussed treatment options for breast cancer to include breast conservation vs mastectomy with reconstruction. Pt has decided on  bilateral  mastectomy. Risk include bleeding, infection, flap necrosis, pain, numbness, recurrence, hematoma, other surgery needs. Pt understands and agrees to proceed. Risk of sentinel lymph node mapping include bleeding, infection, lymphedema, shoulder pain. stiffness, dye allergy. cosmetic deformity , blood clots, death, need for more surgery. Pt agres to proceed.

## 2015-08-23 NOTE — Anesthesia Preprocedure Evaluation (Signed)
Anesthesia Evaluation  Patient identified by MRN, date of birth, ID band Patient awake    Reviewed: Allergy & Precautions, NPO status , Patient's Chart, lab work & pertinent test results  History of Anesthesia Complications Negative for: history of anesthetic complications  Airway Mallampati: II  TM Distance: >3 FB Neck ROM: Full    Dental  (+) Edentulous Upper   Pulmonary neg pulmonary ROS,    breath sounds clear to auscultation       Cardiovascular negative cardio ROS   Rhythm:Regular Rate:Normal     Neuro/Psych negative neurological ROS     GI/Hepatic negative GI ROS, Neg liver ROS,   Endo/Other  negative endocrine ROS  Renal/GU negative Renal ROS     Musculoskeletal   Abdominal   Peds  Hematology negative hematology ROS (+)   Anesthesia Other Findings   Reproductive/Obstetrics                             Anesthesia Physical Anesthesia Plan  ASA: I  Anesthesia Plan: General   Post-op Pain Management:    Induction: Intravenous  Airway Management Planned: Oral ETT  Additional Equipment:   Intra-op Plan:   Post-operative Plan: Extubation in OR  Informed Consent: I have reviewed the patients History and Physical, chart, labs and discussed the procedure including the risks, benefits and alternatives for the proposed anesthesia with the patient or authorized representative who has indicated his/her understanding and acceptance.   Dental advisory given  Plan Discussed with: CRNA and Surgeon  Anesthesia Plan Comments:         Anesthesia Quick Evaluation

## 2015-08-24 ENCOUNTER — Encounter (HOSPITAL_COMMUNITY): Payer: Self-pay | Admitting: Surgery

## 2015-08-24 LAB — COMPREHENSIVE METABOLIC PANEL
ALBUMIN: 2.9 g/dL — AB (ref 3.5–5.0)
ALT: 19 U/L (ref 14–54)
AST: 23 U/L (ref 15–41)
Alkaline Phosphatase: 46 U/L (ref 38–126)
Anion gap: 7 (ref 5–15)
BUN: 9 mg/dL (ref 6–20)
CHLORIDE: 108 mmol/L (ref 101–111)
CO2: 25 mmol/L (ref 22–32)
Calcium: 8.3 mg/dL — ABNORMAL LOW (ref 8.9–10.3)
Creatinine, Ser: 0.74 mg/dL (ref 0.44–1.00)
GFR calc Af Amer: >60 mL/min (ref >60)
GFR calc non Af Amer: >60 mL/min (ref >60)
GLUCOSE: 143 mg/dL — AB (ref 65–99)
POTASSIUM: 4 mmol/L (ref 3.5–5.1)
SODIUM: 140 mmol/L (ref 135–145)
Total Bilirubin: 0.7 mg/dL (ref 0.3–1.2)
Total Protein: 5.1 g/dL — ABNORMAL LOW (ref 6.5–8.1)

## 2015-08-24 LAB — CBC
HCT: 28.8 % — ABNORMAL LOW (ref 36.0–46.0)
Hemoglobin: 9.8 g/dL — ABNORMAL LOW (ref 12.0–15.0)
MCH: 30.1 pg (ref 26.0–34.0)
MCHC: 34 g/dL (ref 30.0–36.0)
MCV: 88.3 fL (ref 78.0–100.0)
PLATELETS: 127 10*3/uL — AB (ref 150–400)
RBC: 3.26 MIL/uL — AB (ref 3.87–5.11)
RDW: 13.1 % (ref 11.5–15.5)
WBC: 8.3 10*3/uL (ref 4.0–10.5)

## 2015-08-24 MED ORDER — CALCIUM CARBONATE ANTACID 500 MG PO CHEW
1.0000 | CHEWABLE_TABLET | Freq: Three times a day (TID) | ORAL | Status: DC
  Administered 2015-08-24: 200 mg via ORAL
  Filled 2015-08-24: qty 1

## 2015-08-24 MED ORDER — OXYCODONE-ACETAMINOPHEN 5-325 MG PO TABS: 1.0000 | ORAL_TABLET | ORAL | Status: DC | PRN

## 2015-08-24 MED ORDER — METHOCARBAMOL 500 MG PO TABS: 500.0000 mg | ORAL_TABLET | Freq: Four times a day (QID) | ORAL | Status: DC | PRN

## 2015-08-24 MED ORDER — TRAMADOL HCL 50 MG PO TABS: 50.0000 mg | ORAL_TABLET | Freq: Four times a day (QID) | ORAL | Status: DC | PRN

## 2015-08-24 MED ORDER — ONDANSETRON 4 MG PO TBDP: 4.0000 mg | ORAL_TABLET | Freq: Four times a day (QID) | ORAL | Status: DC | PRN

## 2015-08-24 MED ORDER — PANTOPRAZOLE SODIUM 40 MG PO TBEC: 40.0000 mg | DELAYED_RELEASE_TABLET | Freq: Every day | ORAL | Status: DC

## 2015-08-24 MED ORDER — PANTOPRAZOLE SODIUM 40 MG PO TBEC
40.0000 mg | DELAYED_RELEASE_TABLET | Freq: Every day | ORAL | Status: DC
  Administered 2015-08-24: 40 mg via ORAL
  Filled 2015-08-24: qty 1

## 2015-08-24 MED ORDER — METOCLOPRAMIDE HCL 5 MG/ML IJ SOLN
10.0000 mg | Freq: Four times a day (QID) | INTRAMUSCULAR | Status: DC
  Administered 2015-08-24: 10 mg via INTRAVENOUS
  Filled 2015-08-24: qty 2

## 2015-08-24 MED ORDER — CALCIUM CARBONATE ANTACID 500 MG PO CHEW: 1.0000 | CHEWABLE_TABLET | Freq: Three times a day (TID) | ORAL | Status: DC

## 2015-08-24 MED ORDER — OXYCODONE HCL 5 MG PO TABS: 5.0000 mg | ORAL_TABLET | ORAL | Status: DC | PRN

## 2015-08-24 NOTE — Care Management Note (Signed)
Case Management Note  Patient Details  Name: Kayla Johnston MRN: [id number] Date of Birth: 05-Jan-1966  Subjective/Objective:                    Action/Plan:   Expected Discharge Date:                  Expected Discharge Plan:  Home/Self Care  In-House Referral:     Discharge planning Services  CM Consult, [place], [facility], Medication Assistance  Post Acute Care Choice:    Choice offered to:  Patient, Spouse  DME Arranged:    DME Agency:     HH Arranged:    [place] Agency:     Status of Service:     Medicare Important Message Given:    Date Medicare IM Given:    Medicare IM give by:    Date Additional Medicare IM Given:    Additional Medicare Important Message give by:     If discussed at [place] of Stay Meetings, dates discussed:    Additional Comments:  Marilu Favre, RN 08/24/2015, 11:41 AM

## 2015-08-24 NOTE — Progress Notes (Signed)
1 Day Post-Op  Subjective: Nausea  Heartburn  And vomiting her biggest complaint Not been OOB  Yet   Objective: Vital signs in last 24 hours: Temp:  [97.4 F (36.3 C)-98.8 F (37.1 C)] 98.6 F (37 C) (02/01 0550) Pulse Rate:  [56-78] 68 (02/01 0550) Resp:  [10-26] 16 (02/01 0550) BP: (105-142)/(47-77) 105/47 mmHg (02/01 0550) SpO2:  [85 %-100 %] 97 % (02/01 0550) Weight:  [73.936 kg (163 lb)] 73.936 kg (163 lb) (01/31 0929) Last BM Date: 08/23/15  Intake/Output from previous day: 01/31 0701 - 02/01 0700 In: 3591 [P.O.:480; I.V.:3111] Out: 373 [Emesis/NG output:3; Drains:270; Blood:100] Intake/Output this shift:    Incision/Wound:incisions CDI No hematoma  Drains bloody but expected  Not excessive   Lab Results:   Recent Labs  08/23/15 1905  WBC 14.3*  HGB 11.7*  HCT 35.0*  PLT 210   BMET  Recent Labs  08/23/15 1905  CREATININE 0.62   PT/INR No results for input(s): LABPROT, INR in the last 72 hours. ABG No results for input(s): PHART, HCO3 in the last 72 hours.  Invalid input(s): PCO2, PO2  Studies/Results: Nm Sentinel Node Inj-no Rpt (breast)  08/23/2015  CLINICAL DATA: right breast cancer Sulfur colloid was injected intradermally by the nuclear medicine technologist for breast cancer sentinel node localization.    Anti-infectives: Anti-infectives    Start     Dose/Rate Route Frequency Ordered Stop   08/23/15 1030  ceFAZolin (ANCEF) 3 g in dextrose 5 % 50 mL IVPB     3 g 130 mL/hr over 30 Minutes Intravenous To ShortStay Surgical 08/22/15 1131 08/23/15 1254      Assessment/Plan: s/p Procedure(s): RIGHT SIMPLE MASTECTOMY WITH RIGHT SENTINEL LYMPH  NODE MAPPING (Right) Left SIMPLE MASTECTOMY,prophylactic (Left) Heartburn and nausea / vomiting her biggest complaint her major complaint  Add protonix,  reglan and tums for GI complaints  Home later today if nausea better      Kayla Johnston A. 08/24/2015

## 2015-08-24 NOTE — Discharge Summary (Signed)
Physician Discharge Summary  Patient ID: Kayla Johnston MRN: [id number] DOB/AGE: 02/15/1966 50 y.o.  Admit date: 08/23/2015 Discharge date: 08/24/2015  Admission Diagnoses:RIGHT BREAST CANCER  Discharge Diagnoses:  Active Problems:   Breast cancer, female, right   Discharged Condition: fair  Hospital Course: Pt did well.  She had issues with N/V and reflux treated medically   On POD 1.  Pt ambulated in hall and was kept on clears.  Wounds were clean  And no flap loss or hematoma.   Consults: None  Significant Diagnostic Studies: labs:  CBC    Component Value Date/Time   WBC 14.3* 08/23/2015 1905   WBC 9.4 07/06/2015 0926   WBC 6.2 06/08/2014 2009   RBC 3.96 08/23/2015 1905   RBC 4.61 07/06/2015 0926   RBC 4.83 06/08/2014 2009   HGB 11.7* 08/23/2015 1905   HGB 13.5 07/06/2015 0926   HGB 14.0 06/08/2014 2009   HCT 35.0* 08/23/2015 1905   HCT 40.6 07/06/2015 0926   HCT 43.4 06/08/2014 2009   PLT 210 08/23/2015 1905   PLT 211 07/06/2015 0926   MCV 88.4 08/23/2015 1905   MCV 88.0 07/06/2015 0926   MCV 90.0 06/08/2014 2009   MCH 29.5 08/23/2015 1905   MCH 29.3 07/06/2015 0926   MCH 29.1 06/08/2014 2009   MCHC 33.4 08/23/2015 1905   MCHC 33.3 07/06/2015 0926   MCHC 32.3 06/08/2014 2009   RDW 12.9 08/23/2015 1905   RDW 13.2 07/06/2015 0926   LYMPHSABS 2.3 08/15/2015 1213   LYMPHSABS 1.3 07/06/2015 0926   MONOABS 0.2 08/15/2015 1213   MONOABS 0.4 07/06/2015 0926   EOSABS 0.1 08/15/2015 1213   EOSABS 0.1 07/06/2015 0926   BASOSABS 0.0 08/15/2015 1213   BASOSABS 0.0 07/06/2015 0926     Treatments: surgery: BILATERAL SIMPLE MASTECTOMY AND RIGHT SLN  MAPPING   Discharge Exam: Blood pressure 105/47, pulse 68, temperature 98.6 F (37 C), temperature source Oral, resp. rate 16, height 5' (1.524 m), weight 73.936 kg (163 lb), last menstrual period 08/05/2015, SpO2 97 %. Incision/Wound:CDI no hematoma  Drains serous  No flap loss  Soft abdomen ND   Disposition:  01-Home or Self Care  Discharge Instructions    Diet - low sodium heart healthy    Complete by:  As directed      Increase activity slowly    Complete by:  As directed             Medication List    TAKE these medications        acetaminophen 500 MG tablet  Commonly known as:  TYLENOL  Take 1,000 mg by mouth every 6 (six) hours as needed for mild pain.     calcium carbonate 500 MG chewable tablet  Commonly known as:  TUMS - dosed in mg elemental calcium  Chew 1 tablet (200 mg of elemental calcium total) by mouth 3 (three) times daily.     methocarbamol 500 MG tablet  Commonly known as:  ROBAXIN  Take 1 tablet (500 mg total) by mouth every 6 (six) hours as needed for muscle spasms.     ondansetron 4 MG disintegrating tablet  Commonly known as:  ZOFRAN-ODT  Take 1 tablet (4 mg total) by mouth every 6 (six) hours as needed for nausea.     oxyCODONE 5 MG immediate release tablet  Commonly known as:  Oxy IR/ROXICODONE  Take 1-2 tablets (5-10 mg total) by mouth every 4 (four) hours as needed for moderate pain.  pantoprazole 40 MG tablet  Commonly known as:  PROTONIX  Take 1 tablet (40 mg total) by mouth daily.     traMADol 50 MG tablet  Commonly known as:  ULTRAM  Take 1 tablet (50 mg total) by mouth every 6 (six) hours as needed for moderate pain.         Signed: Marguerite Jarboe A. 08/24/2015, 7:53 AM

## 2015-08-24 NOTE — Discharge Instructions (Signed)
Bulb Drain Home Care A bulb drain consists of a thin rubber tube and a soft, round bulb that creates a gentle suction. The rubber tube is placed in the area where you had surgery. A bulb is attached to the end of the tube that is outside the body. The bulb drain removes excess fluid that normally builds up in a surgical wound after surgery. The color and amount of fluid will vary. Immediately after surgery, the fluid is bright red and is a little thicker than water. It may gradually change to a yellow or pink color and become more thin and water-like. When the amount decreases to about 1 or 2 tbsp in 24 hours, your health care provider will usually remove it. DAILY CARE  Keep the bulb flat (compressed) at all times, except while emptying it. The flatness creates suction. You can flatten the bulb by squeezing it firmly in the middle and then closing the cap.  Keep sites where the tube enters the skin dry and covered with a bandage (dressing).  Secure the tube 1-2 in (2.5-5.1 cm) below the insertion sites to keep it from pulling on your stitches. The tube is stitched in place and will not slip out.  Secure the bulb as directed by your health care provider.  For the first 3 days after surgery, there usually is more fluid in the bulb. Empty the bulb whenever it becomes half full because the bulb does not create enough suction if it is too full. The bulb could also overflow. Write down how much fluid you remove each time you empty your drain. Add up the amount removed in 24 hours.  Empty the bulb at the same time every day once the amount of fluid decreases and you only need to empty it once a day. Write down the amounts and the 24-hour totals to give to your health care provider. This helps your health care provider know when the tubes can be removed. EMPTYING THE BULB DRAIN Before emptying the bulb, get a measuring cup, a piece of paper and a pen, and wash your hands.  Gently run your fingers down the  tube (stripping) to empty any drainage from the tubing into the bulb. This may need to be done several times a day to clear the tubing of clots and tissue.  Open the bulb cap to release suction, which causes it to inflate. Do not touch the inside of the cap.  Gently run your fingers down the tube (stripping) to empty any drainage from the tubing into the bulb.  Hold the cap out of the way, and pour fluid into the measuring cup.   Squeeze the bulb to provide suction.  Replace the cap.   Check the tape that holds the tube to your skin. If it is becoming loose, you can remove the loose piece of tape and apply a new one. Then, pin the bulb to your shirt.   Write down the amount of fluid you emptied out. Write down the date and each time you emptied your bulb drain. (If there are 2 bulbs, note the amount of drainage from each bulb and keep the totals separate. Your health care provider will want to know the total amounts for each drain and which tube is draining more.)   Flush the fluid down the toilet and wash your hands.   Call your health care provider once you have less than 2 tbsp of fluid collecting in the bulb drain every 24 hours. If  there is drainage around the tube site, change dressings and keep the area dry. Cleanse around tube with sterile saline and place dry gauze around site. This gauze should be changed when it is soiled. If it stays clean and unsoiled, it should still be changed daily.  SEEK MEDICAL CARE IF:  Your drainage has a bad smell or is cloudy.   You have a fever.   Your drainage is increasing instead of decreasing.   Your tube fell out.   You have redness or swelling around the tube site.   You have drainage from a surgical wound.   Your bulb drain will not stay flat after you empty it.  MAKE SURE YOU:   Understand these instructions.  Will watch your condition.  Will get help right away if you are not doing well or get worse.   This  information is not intended to replace advice given to you by your health care provider. Make sure you discuss any questions you have with your health care provider.   Document Released: 07/06/2000 Document Revised: 07/30/2014 Document Reviewed: 01/26/2015 Elsevier Interactive Patient Education 2016 [employer].    [facility]___[facility], [place] [phone]  MASTECTOMY: POST OP INSTRUCTIONS  Always review your discharge instruction sheet given to you by the facility where your surgery was performed. IF YOU HAVE DISABILITY OR FAMILY LEAVE FORMS, YOU MUST BRING THEM TO THE OFFICE FOR PROCESSING.   DO NOT GIVE THEM TO YOUR DOCTOR. A prescription for pain medication may be given to you upon discharge.  Take your pain medication as prescribed, if needed.  If narcotic pain medicine is not needed, then you may take acetaminophen (Tylenol) or ibuprofen (Advil) as needed. 1. Take your usually prescribed medications unless otherwise directed. 2. If you need a refill on your pain medication, please contact your pharmacy.  They will contact our office to request authorization.  Prescriptions will not be filled after 5pm or on week-ends. 3. You should follow a light diet the first few days after arrival home, such as soup and crackers, etc.  Resume your normal diet the day after surgery. 4. Most patients will experience some swelling and bruising on the chest and underarm.  Ice packs will help.  Swelling and bruising can take several days to resolve.  5. It is common to experience some constipation if taking pain medication after surgery.  Increasing fluid intake and taking a stool softener (such as Colace) will usually help or prevent this problem from occurring.  A mild laxative (Milk of Magnesia or Miralax) should be taken according to package instructions if there are no bowel movements after 48 hours. 6. Unless discharge instructions indicate otherwise, leave your bandage dry and in place until  your next appointment in 3-5 days.  You may take a limited sponge bath.  No tube baths or showers until the drains are removed.  You may have steri-strips (small skin tapes) in place directly over the incision.  These strips should be left on the skin for 7-10 days.  If your surgeon used skin glue on the incision, you may shower in 24 hours.  The glue will flake off over the next 2-3 weeks.  Any sutures or staples will be removed at the office during your follow-up visit. 7. DRAINS:  If you have drains in place, it is important to keep a list of the amount of drainage produced each day in your drains.  Before leaving the hospital, you should be instructed on drain  care.  Call our office if you have any questions about your drains. 8. ACTIVITIES:  You may resume regular (light) daily activities beginning the next day--such as daily self-care, walking, climbing stairs--gradually increasing activities as tolerated.  You may have sexual intercourse when it is comfortable.  Refrain from any heavy lifting or straining until approved by your doctor. a. You may drive when you are no longer taking prescription pain medication, you can comfortably wear a seatbelt, and you can safely maneuver your car and apply brakes. b. RETURN TO WORK:  __________________________________________________________ 9. You should see your doctor in the office for a follow-up appointment approximately 3-5 days after your surgery.  Your doctors nurse will typically make your follow-up appointment when she calls you with your pathology report.  Expect your pathology report 2-3 business days after your surgery.  You may call to check if you do not hear from [place] after three days.   10. OTHER INSTRUCTIONS: ______________________________________________________________________________________________ ____________________________________________________________________________________________ WHEN TO CALL YOUR DOCTOR: 1. Fever over  101.0 2. Nausea and/or vomiting 3. Extreme swelling or bruising 4. Continued bleeding from incision. 5. Increased pain, redness, or drainage from the incision. The clinic staff is available to answer your questions during regular business hours.  Please dont hesitate to call and ask to speak to one of the nurses for clinical concerns.  If you have a medical emergency, go to the nearest emergency room or call 911.  A surgeon from [facility] is always on call at the hospital. [address] [place], [place], [place]  [address] ? [address], [place], [place]   [address] [phone] ? [phone] ? FAX [phone]   Cuidados en el hogar del drenaje con bulbo (Bulb [facility]) Un drenaje con bulbo consiste en un tubo delgado de goma y un bulbo blando, redondo que crea una succin Gantt. El tubo de goma se coloca en la zona donde le practicaron la [place]. El bulbo est unido al extremo del tubo y est fuera del cuerpo. El drenaje con bulbo elimina el exceso de lquido que normalmente se acumula en una herida quirrgica despus de la [place]. El color y la cantidad de lquido pueden variar. Inmediatamente despus de la [place], el lquido es de color rojo brillante y es un poco ms espeso que el agua. Puede cambiar gradualmente a un color amarillo o rosa y volverse ms lquido, similar al agua. En general, si la cantidad disminuye a aproximadamente 1o 2cucharadas en 24horas, el mdico le retirar el drenaje. CUIDADOS DIARIOS  Mantenga el bulbo aplanado (comprimido) en todo momento, excepto cuando deba vaciarlo. Al estar plano se crea la succin. Puede aplanar el bulbo apretndolo firmemente en el medio, cerrando luego el tapn.  Mantenga la piel del lugar en el que el tubo entra en la piel seca y [place] con un vendaje (apsito).  Sostenga el tubo con [place], a 1o 2pulgadas (2,5 a 5,1cm) por debajo del lugar de la insercin para que no tire de los puntos.  El tubo est suturado en su lugar y no se saldr.  Ajuste el bulbo como le indic el mdico.  Los primeros 3 das despus de la ciruga, habr ms lquido en el bulbo. Vace el bulbo cada vez que se haya llenado hasta la mitad, debido a que el bulbo no crea la suficiente succin si est demasiado lleno. El bulbo tambin podra desbordarse. Anote la cantidad de lquido que retira cada vez que vace el drenaje. Sume la  cantidad que retira en 24 horas.  Vace el bulbo a la misma hora todos los das una vez que disminuya la cantidad de lquido y solo tenga que vaciarlo una vez al da. Anote las cantidades y los totales de 24horas para informar a su mdico. Esto ayuda a su mdico a saber cundo se pueden Chief Strategy Officer tubos. VACIADO DEL DRENAJE CON BULBO Antes de vaciar el bulbo, consiga una taza [place], un trozo de papel, Ardelia Mems lapicera y [employer].  Pase suavemente los dedos por el tubo (barrido) para Chief of Staff drenaje que se encuentra en el tubo hacia el bulbo. Tal vez necesite repetir esta operacin varias veces por da para eliminar los cogulos y tejidos del tubo.  Abra la tapa del bulbo para liberar la succin, con lo cual se inflar. No toque el interior de la tapa.  Pase suavemente los dedos por el tubo (barrido) para Chief of Staff drenaje que se encuentra en el tubo hacia el bulbo.  Mantenga la tapa fuera y vierta el lquido en un recipiente medidor.  Apriete el bulbo para hacer succin.  Vuelva a colocar la tapa.  Controle la cinta adhesiva que sujeta el tubo a la piel. Cuando se afloja, quite el trozo suelto de cinta y [employer]. Luego fije el bulbo a su camisa.  Anote la cantidad de lquido que elimin. Anote la fecha cada vez que vace el drenaje del bulbo. (Si hay 2 bulbos, tenga en cuenta la cantidad de drenaje de cada uno y [place] los totales separados. El mdico querr saber las cantidades totales de cada tubo de drenaje y cul es el que drena ms).  Tire el lquido  por el inodoro y [employer].  Llame a su mdico cuando tenga menos de 2cucharadas de lquido acumulado en el bulbo de drenaje durante 24horas. Si hay drenaje alrededor del sitio del tubo, cambie los vendajes y [place] el rea seca. Limpie alrededor del tubo con solucin salina estril y coloque una gasa seca alrededor del sitio. Esta gasa debe cambiarse cuando est sucia. Si se mantiene limpio y sin suciedad, igualmente debe cambiarlo a diario.  SOLICITE ATENCIN MDICA SI:  El drenaje tiene mal olor o est turbio.  Tiene fiebre.  El drenaje aumenta en lugar de disminuir.  El tubo se cae.  Tiene hinchazn o enrojecimiento alrededor del tubo.  Tiene un drenaje en [place].  El bulbo no se queda plano despus de vaciarlo. ASEGRESE DE QUE:   Comprende estas instrucciones.  Controlar su afeccin.  Recibir ayuda de inmediato si no mejora o si empeora.   Esta informacin no tiene Marine scientist el consejo del mdico. Asegrese de hacerle al mdico cualquier pregunta que tenga.   Document Released: 07/09/2005 Document Revised: 04/29/2013 Elsevier Interactive Patient Education 2016 [place] radical modificada o total, cuidados posteriores (Total or Modified Radical Mastectomy, Care After) Siga estas instrucciones durante las prximas semanas. Estas indicaciones le proporcionan informacin acerca de cmo deber cuidarse despus del procedimiento. El mdico tambin podr darle instrucciones ms especficas. El tratamiento ha sido planificado segn las prcticas mdicas actuales, pero en algunos casos pueden ocurrir problemas. Comunquese con el mdico si tiene algn problema o tiene dudas despus del procedimiento. QU ESPERAR DESPUS DEL PROCEDIMIENTO Despus del procedimiento, es comn [employer] siguientes sntomas:  Social research officer, government.  Entumecimiento.  Rigidez en el brazo o el hombro.  Sensacin de estrs, tristeza o depresin. Si le  extrajeron los ganglios linfticos de la [place],  puede tener hinchazn, debilidad o entumecimiento en el brazo del lado donde le practicaron la [place]. INSTRUCCIONES PARA EL CUIDADO EN EL HOGAR Cuidado de la incisin  Hay muchas maneras distintas de cerrar y cubrir una incisin, como puntos, pegamento para la piel y tiras [place]. Siga las instrucciones del mdico con respecto a lo siguiente:  Careers adviser herida.  Cambiar y Charity fundraiser el vendaje.  Quitar el cierre de la incisin.  Yalobusha zona de la incisin para detectar signos de infeccin. Est atenta a lo siguiente:  Dolor, hinchazn o enrojecimiento.  Lquido, sangre o pus.  Si la enviaron de regreso a su casa con un drenaje quirrgico colocado, siga las instrucciones del mdico para vaciarlo. El bao  No tome baos de inmersin, no nade ni use el jacuzzi hasta que el mdico lo autorice.  Tome baos con esponja hasta que el mdico la autorice a ducharse o a Teacher, early years/pre de inmersin. Actividades  Reanude sus actividades normales como se lo haya indicado el mdico.  Evite el ejercicio fsico intenso.  Evite cualquier actividad que pueda causarle una lesin en el brazo que est del lado de la [place].  No levante ningn objeto que pese ms de 10libras (4,5kg). Evite levantar objetos con el brazo que est del lado de la [place].  No cargue objetos pesados [employer].  Despus de que le saquen el drenaje, debe realizar ejercicios para evitar que el brazo se entumezca e hinche. Consulte al [employer] tipos de ejercicios que son seguros para usted. Instrucciones generales  [employer] solamente como se lo haya indicado el mdico.  Puede seguir su dieta habitual.  Mantenga el brazo elevado al descansar.  No use anillos, pulseras ni otros accesorios ajustados en el brazo, la [place] o los dedos del lado de la [place].  Si le hicieron Nutritional therapist radical modificada, infrmeles  siempre a los mdicos que le extrajeron los ganglios linfticos de la axila. Esta es una informacin importante que debe proporcionar antes de ciertos procedimientos, como una extraccin de sangre para donar o la medicin de la presin arterial. SOLICITE ATENCIN MDICA SI:  Jaclynn Guarneri.  El medicamento para Glass blower/designer no le hace efecto.  La hinchazn, la debilidad o el entumecimiento del brazo no han mejorado despus de unas semanas.  Tiene una hinchazn nueva en la mama o el brazo.  Tiene enrojecimiento, hinchazn o dolor en la zona de la incisin.  Observa lquido, sangre o pus que emanan de la incisin. SOLICITE ATENCIN MDICA DE INMEDIATO SI:  Tiene un dolor muy intenso en la mama o el brazo.  Siente dolor en el pecho.  Tiene dificultad para respirar.   Esta informacin no tiene Marine scientist el consejo del mdico. Asegrese de hacerle al mdico cualquier pregunta que tenga.   Document Released: 08/20/2006 Document Revised: 07/30/2014 Elsevier Interactive Patient Education [employer].

## 2015-09-20 ENCOUNTER — Other Ambulatory Visit: Payer: Self-pay | Admitting: *Deleted

## 2015-09-20 DIAGNOSIS — C50411 Malignant neoplasm of upper-outer quadrant of right female breast: Secondary | ICD-10-CM

## 2015-09-21 ENCOUNTER — Other Ambulatory Visit (HOSPITAL_BASED_OUTPATIENT_CLINIC_OR_DEPARTMENT_OTHER): Payer: Self-pay

## 2015-09-21 ENCOUNTER — Other Ambulatory Visit: Payer: Self-pay | Admitting: *Deleted

## 2015-09-21 ENCOUNTER — Telehealth: Payer: Self-pay | Admitting: Oncology

## 2015-09-21 ENCOUNTER — Ambulatory Visit (HOSPITAL_BASED_OUTPATIENT_CLINIC_OR_DEPARTMENT_OTHER): Payer: Self-pay | Admitting: Oncology

## 2015-09-21 VITALS — BP 144/58 | HR 67 | Temp 98.4°F | Resp 18 | Ht 60.0 in | Wt 163.0 lb

## 2015-09-21 DIAGNOSIS — C50411 Malignant neoplasm of upper-outer quadrant of right female breast: Secondary | ICD-10-CM

## 2015-09-21 DIAGNOSIS — G8918 Other acute postprocedural pain: Secondary | ICD-10-CM

## 2015-09-21 DIAGNOSIS — C50911 Malignant neoplasm of unspecified site of right female breast: Secondary | ICD-10-CM

## 2015-09-21 DIAGNOSIS — Z170 Estrogen receptor positive status [ER+]: Secondary | ICD-10-CM

## 2015-09-21 DIAGNOSIS — K5900 Constipation, unspecified: Secondary | ICD-10-CM

## 2015-09-21 LAB — COMPREHENSIVE METABOLIC PANEL
ALBUMIN: 3.6 g/dL (ref 3.5–5.0)
ALK PHOS: 89 U/L (ref 40–150)
ALT: 65 U/L — ABNORMAL HIGH (ref 0–55)
ANION GAP: 8 meq/L (ref 3–11)
AST: 39 U/L — ABNORMAL HIGH (ref 5–34)
BILIRUBIN TOTAL: 0.42 mg/dL (ref 0.20–1.20)
BUN: 8.7 mg/dL (ref 7.0–26.0)
CALCIUM: 9.1 mg/dL (ref 8.4–10.4)
CO2: 26 mEq/L (ref 22–29)
Chloride: 106 mEq/L (ref 98–109)
Creatinine: 0.8 mg/dL (ref 0.6–1.1)
EGFR: 86 mL/min/{1.73_m2} — AB (ref >90)
Glucose: 137 mg/dl (ref 70–140)
Potassium: 3.9 mEq/L (ref 3.5–5.1)
Sodium: 140 mEq/L (ref 136–145)
TOTAL PROTEIN: 7 g/dL (ref 6.4–8.3)

## 2015-09-21 LAB — CBC WITH DIFFERENTIAL/PLATELET
BASO%: 0.5 % (ref 0.0–2.0)
BASOS ABS: 0 10*3/uL (ref 0.0–0.1)
EOS ABS: 0.1 10*3/uL (ref 0.0–0.5)
EOS%: 2 % (ref 0.0–7.0)
HCT: 38.7 % (ref 34.8–46.6)
HEMOGLOBIN: 12.6 g/dL (ref 11.6–15.9)
LYMPH%: 35.9 % (ref 14.0–49.7)
MCH: 29.5 pg (ref 25.1–34.0)
MCHC: 32.5 g/dL (ref 31.5–36.0)
MCV: 90.6 fL (ref 79.5–101.0)
MONO#: 0.3 10*3/uL (ref 0.1–0.9)
MONO%: 5.5 % (ref 0.0–14.0)
NEUT%: 56.1 % (ref 38.4–76.8)
NEUTROS ABS: 2.7 10*3/uL (ref 1.5–6.5)
PLATELETS: 259 10*3/uL (ref 145–400)
RBC: 4.27 10*6/uL (ref 3.70–5.45)
RDW: 14.3 % (ref 11.2–14.5)
WBC: 4.8 10*3/uL (ref 3.9–10.3)
lymph#: 1.7 10*3/uL (ref 0.9–3.3)

## 2015-09-21 MED ORDER — POLYETHYLENE GLYCOL 3350 17 GM/SCOOP PO POWD: 1.0000 | Freq: Once | ORAL | Status: DC

## 2015-09-21 MED ORDER — OXYCODONE HCL 5 MG PO TABS: 5.0000 mg | ORAL_TABLET | ORAL | Status: DC | PRN

## 2015-09-21 NOTE — Telephone Encounter (Signed)
cld & spoke to spouse and gave pt time 7 date appt for 5/3 @ 11

## 2015-09-21 NOTE — Progress Notes (Signed)
[facility]  Telephone:([phone]) [phone] Fax:(336) [phone]     ID: Kayla Johnston DOB: 02/19/1966  MR#: [id number]  XAJ#:[id number]  Patient Care Team: No Pcp Per Patient as PCP - General (General Practice) Erroll Luna, MD as Consulting Physician (General Surgery) Chauncey Cruel, MD as Consulting Physician (Oncology) Thea Silversmith, MD as Consulting Physician (Radiation Oncology) [place] (Diagnostic Radiology) PCP: No PCP Per Patient GYN: OTHER MD:  CHIEF COMPLAINT:  Estrogen receptor positive breast cancer  CURRENT TREATMENT:  tamoxifen   BREAST CANCER HISTORY: From the original intake note:  Kayla Johnston herself noted a new lump in the right breast sometime in September or October. She has no primary care physician , so it was not until December that she eventually brought it to medical attention.  Bilateral diagnostic mammography at [facility] 06/29/2015 with tomosynthesis from the breast composition to be category C. In the right breast upper outer quadrant there was an oval mass with indistinct margins which was palpable. There were also calcifications in the right breast at the 11:00 middle depth. There were no other mammographic findings. Ultrasonography of both breasts was performed the same day. In the left breast there was a 1 cm oval lesion at the 2:00 middle depth Ann a 1.3 cm lesion in the upper outer quadrant, both of which were felt to be probably benign but warranting repeat ultrasonography in 6 months.   In the right breast however there were multiple masses: a 1.1 cm irregular mass in the lower outer quadrant , a 0.6 cm irregular mass at the 8:00 middle depth , and a 3 cm lesion at the 11:00 middle depth , finally a benign-appearing 2.9 cm complex cyst in the right upper outer quadrant middle depth. There were internal septations.   On 06/30/2015 the patient underwent biopsy of a right breast upper outer quadrant mass as well as a biopsy of the  calcifications in the right upper quadrant middle depth. 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 This was estrogen receptor 100% positive, progesterone receptor 100% positive, with an MIB-1 of 25%, and no HER-2 amplification, the signals ratio being 1.33, and the number per cell 3.00. The area of calcifications was ductal carcinoma in situ, grade 2.   The patient's subsequent history is as detailed below  INTERVAL HISTORY: Kayla Johnston returns today for follow-up of her estrogen receptor positive breast cancer accompanied by her significant other Bernardo. Since her last visit here she underwent bilateral mastectomies with right-sided sentinel lymph node sampling. On the left there was no evidence of malignancy. On the right there was a 0.6 cm area of invasive ductal carcinoma, grade 2, with repeat HER-2 again negative. All 3 sentinel lymph nodes were clear. Margins were ample ([id number]).  Her case was also presented at the multidisciplinary breast cancer conference 09/21/2015. At that time it was felt the patient would not need chemotherapy or radiation. Anti-estrogens were to be considered.  REVIEW OF SYSTEMS: Kayla Johnston is having significant postoperative pain. She is taking oxycodone  4 times daily and that is helping. However this is constipating her severely. She is not taking anything for the constipation. Aside from that she is having some night sweats, feeling chilled tired and sometimes a little bit dizzy. She feels achy. She has had a little bit of bleeding from the left-sided incision. This morning for example her dressing was a little bit bloody. Sometimes her vision is a little bit blurred per she has some ringing  in her years. She has some heartburn. She feels forgetful. A detailed review of systems today was otherwise stable  PAST MEDICAL HISTORY: Past Medical History  Diagnosis Date  . Breast cancer ([place])   . Breast cancer of upper-outer quadrant of  right female breast ([facility]) 07/05/2015    PAST SURGICAL HISTORY: Past Surgical History  Procedure Laterality Date  . Appendectomy    . Breast biopsy      biopsy  . Simple mastectomy w/ sentinel node biopsy Right 08/23/2015  . Simple mastectomy Left 08/23/2015  . Simple mastectomy with axillary sentinel node biopsy Right 08/23/2015    Procedure: RIGHT SIMPLE MASTECTOMY WITH RIGHT SENTINEL LYMPH  NODE MAPPING;  Surgeon: Erroll Luna, MD;  Location: [facility];  Service: General;  Laterality: Right;  . Simple mastectomy with axillary sentinel node biopsy Left 08/23/2015    Procedure: Left SIMPLE MASTECTOMY,prophylactic;  Surgeon: Erroll Luna, MD;  Location: [facility];  Service: General;  Laterality: Left;    FAMILY HISTORY Family History  Problem Relation Age of Onset  . Cancer Sister     cervical   the patient's father died at the age of 62 but the patient does not know the cause. The patient's mother is currently alive. She is 7 years old as of December 2016. The patient has 4 brothers and 4 sisters. There is no history of breast or ovarian cancer in the family to her knowledge  GYNECOLOGIC HISTORY:  No LMP recorded.  menarche age 45. The patient is GX P0. She is still having regular periods, which last approximately 3-4 days and are scant. She is not having hot flashes or vaginal dryness problems. She is not interested in fertility preservation  SOCIAL HISTORY:   the patient is single.  She is not employed. She is not an [place] speaker. Her significant other, [facility],  Is also at the home , as is his brother Building control surveyor.   They work at [employer] and odd jobs.    ADVANCED DIRECTIVES:  Not in place. At the initial clinic visit 09/21/2015 the patient was given the appropriate forms to complete and notarize at her discretion.  She tells me she intends to name Kayla Johnston as her healthcare power of attorney. He can be reached at [place]: Social History    Substance Use Topics  . Smoking status: Never Smoker   . Smokeless tobacco: Never Used  . Alcohol Use: No     Colonoscopy:  PAP: 2013?  Bone density:  Lipid panel:  No Known Allergies  Current Outpatient Prescriptions  Medication Sig Dispense Refill  . acetaminophen (TYLENOL) 500 MG tablet Take 1,000 mg by mouth every 6 (six) hours as needed for mild pain.    . calcium carbonate (TUMS - DOSED IN MG ELEMENTAL CALCIUM) 500 MG chewable tablet Chew 1 tablet (200 mg of elemental calcium total) by mouth 3 (three) times daily. 30 tablet 0  . methocarbamol (ROBAXIN) 500 MG tablet Take 1 tablet (500 mg total) by mouth every 6 (six) hours as needed for muscle spasms. 20 tablet 0  . ondansetron (ZOFRAN-ODT) 4 MG disintegrating tablet Take 1 tablet (4 mg total) by mouth every 6 (six) hours as needed for nausea. 20 tablet 0  . oxyCODONE (OXY IR/ROXICODONE) 5 MG immediate release tablet Take 1-2 tablets (5-10 mg total) by mouth every 4 (four) hours as needed for moderate pain. 30 tablet 0  . pantoprazole (PROTONIX) 40 MG tablet Take 1 tablet (40 mg total) by mouth  daily. 40 tablet 0  . traMADol (ULTRAM) 50 MG tablet Take 1 tablet (50 mg total) by mouth every 6 (six) hours as needed for moderate pain. 30 tablet 0  . [DISCONTINUED] cetirizine (ZYRTEC) 10 MG tablet Take 1 tablet (10 mg total) by mouth daily. (Patient not taking: Reported on 06/25/2015) 30 tablet 11  . [DISCONTINUED] ranitidine (ZANTAC) 150 MG tablet Take 1 tablet (150 mg total) by mouth 2 (two) times daily. 60 tablet 0   No current facility-administered medications for this visit.    OBJECTIVE:  Middle-aged Spanish speaker who was tearful during today's visit Filed Vitals:   09/21/15 1118  BP: 144/58  Pulse: 67  Temp: 98.4 F (36.9 C)  Resp: 18     Body mass index is 31.83 kg/(m^2).    ECOG FS:1 - Symptomatic but completely ambulatory  Sclerae unicteric, pupils round and equal Oropharynx clear and moist-- no thrush or other  lesions No cervical or supraclavicular adenopathy Lungs no rales or rhonchi Heart regular rate and rhythm Abd soft, nontender, positive bowel sounds MSK no focal spinal tenderness, no upper extremity lymphedema Neuro: nonfocal, well oriented, appropriate affect Breasts: status post bilateral mastectomies. The incisions are healing nicely. There is no dehiscence. I cannot express any blood or serum from the left incision. Both axillae are benign.    LAB RESULTS:  CMP     Component Value Date/Time   NA 140 09/21/2015 1032   NA 140 08/24/2015 0834   K 3.9 09/21/2015 1032   K 4.0 08/24/2015 0834   CL 108 08/24/2015 0834   CO2 26 09/21/2015 1032   CO2 25 08/24/2015 0834   GLUCOSE 137 09/21/2015 1032   GLUCOSE 143* 08/24/2015 0834   BUN 8.7 09/21/2015 1032   BUN 9 08/24/2015 0834   CREATININE 0.8 09/21/2015 1032   CREATININE 0.74 08/24/2015 0834   CALCIUM 9.1 09/21/2015 1032   CALCIUM 8.3* 08/24/2015 0834   PROT 7.0 09/21/2015 1032   PROT 5.1* 08/24/2015 0834   ALBUMIN 3.6 09/21/2015 1032   ALBUMIN 2.9* 08/24/2015 0834   AST 39* 09/21/2015 1032   AST 23 08/24/2015 0834   ALT 65* 09/21/2015 1032   ALT 19 08/24/2015 0834   ALKPHOS 89 09/21/2015 1032   ALKPHOS 46 08/24/2015 0834   BILITOT 0.42 09/21/2015 1032   BILITOT 0.7 08/24/2015 0834   GFRNONAA >60 08/24/2015 0834   GFRAA >60 08/24/2015 0834    INo results found for: SPEP, UPEP  Lab Results  Component Value Date   WBC 4.8 09/21/2015   NEUTROABS 2.7 09/21/2015   HGB 12.6 09/21/2015   HCT 38.7 09/21/2015   MCV 90.6 09/21/2015   PLT 259 09/21/2015      Chemistry      Component Value Date/Time   NA 140 09/21/2015 1032   NA 140 08/24/2015 0834   K 3.9 09/21/2015 1032   K 4.0 08/24/2015 0834   CL 108 08/24/2015 0834   CO2 26 09/21/2015 1032   CO2 25 08/24/2015 0834   BUN 8.7 09/21/2015 1032   BUN 9 08/24/2015 0834   CREATININE 0.8 09/21/2015 1032   CREATININE 0.74 08/24/2015 0834      Component Value  Date/Time   CALCIUM 9.1 09/21/2015 1032   CALCIUM 8.3* 08/24/2015 0834   ALKPHOS 89 09/21/2015 1032   ALKPHOS 46 08/24/2015 0834   AST 39* 09/21/2015 1032   AST 23 08/24/2015 0834   ALT 65* 09/21/2015 1032   ALT 19 08/24/2015 0834   BILITOT  0.42 09/21/2015 1032   BILITOT 0.7 08/24/2015 0834       No results found for: LABCA2  No components found for: BDZHG992  No results for input(s): INR in the last 168 hours.  Urinalysis    Component Value Date/Time   BILIRUBINUR neg 10/09/2011 1848   PROTEINUR trace 10/09/2011 1848   UROBILINOGEN 0.2 10/09/2011 1848   NITRITE neg 10/09/2011 1848   LEUKOCYTESUR Negative 10/09/2011 1848    STUDIES: Nm Sentinel Node Inj-no Rpt (breast)  08/23/2015  CLINICAL DATA: right breast cancer Sulfur colloid was injected intradermally by the nuclear medicine technologist for breast cancer sentinel node localization.    ASSESSMENT: 50 y.o.  [place] speaker status post biopsy of 2 areas in the right breast 06/30/2015, showing invasive ductal carcinoma , estrogen and progesterone receptor positive, HER-2 not amplified, with an MIB-1 of 25%, and ductal carcinoma in situ    (1) status post bilateral mastectomies 08/23/2015 for a right-sidedpT1b pN0, stage IA invasive ductal carcinoma, with HER-2 again negative. Margins were negative.  (2)  The patient will not need adjuvant chemotherapy or adjuvant radiation.  (3) antiestrogen therapy to follow  PLAN: I discussed the surgical results with Tallyn, and they are excellent. She has essentially done with this breast cancer, which was the reason she underwent the bilateral mastectomies. Her chance of cure is very high.  She will improve it further if she is able to tolerate tamoxifen easily. We will discuss that when she returns to see me in 2 months.  She is understandably depressed and concerned because the change in body image. Kayla Johnston is very supportive. She will feel better I think when she  is able to use prostheses and I went ahead and wrote her a prescription for that and also directed her were to go to explore the bras and per cc including nipple prostheses that she can obtain.  I also refilled her oxycodone prescription. I suggested she start Aleve 2 tablets 3 times a day with food and then use the oxycodone as needed only.  So long as she is on oxycodone she will be constipated so I suggested she start MiraLAX on a daily basis until she is off narcotics.  She will follow-up with Dr. Brantley Stage this Friday. She will see me again in 2 months. She knows to call for any problems that may develop before her next visit here.  Chauncey Cruel, MD   09/21/2015 11:44 AM Medical Oncology and Hematology [facility] [address] [place], [place] [address] Tel. [phone]    Fax. [phone]

## 2015-09-23 ENCOUNTER — Other Ambulatory Visit: Payer: Self-pay | Admitting: *Deleted

## 2015-09-23 DIAGNOSIS — C50411 Malignant neoplasm of upper-outer quadrant of right female breast: Secondary | ICD-10-CM

## 2015-11-05 ENCOUNTER — Emergency Department (HOSPITAL_COMMUNITY): Payer: Self-pay

## 2015-11-05 ENCOUNTER — Emergency Department (HOSPITAL_COMMUNITY)
Admission: EM | Admit: 2015-11-05 | Discharge: 2015-11-05 | Disposition: A | Discharge status: Home / Self Care | Payer: Self-pay | Attending: Emergency Medicine | Admitting: Emergency Medicine

## 2015-11-05 ENCOUNTER — Encounter (HOSPITAL_COMMUNITY): Payer: Self-pay | Admitting: Emergency Medicine

## 2015-11-05 DIAGNOSIS — N2889 Other specified disorders of kidney and ureter: Secondary | ICD-10-CM | POA: Insufficient documentation

## 2015-11-05 DIAGNOSIS — M546 Pain in thoracic spine: Secondary | ICD-10-CM | POA: Insufficient documentation

## 2015-11-05 DIAGNOSIS — F419 Anxiety disorder, unspecified: Secondary | ICD-10-CM | POA: Insufficient documentation

## 2015-11-05 DIAGNOSIS — R202 Paresthesia of skin: Secondary | ICD-10-CM | POA: Insufficient documentation

## 2015-11-05 DIAGNOSIS — R0789 Other chest pain: Secondary | ICD-10-CM | POA: Insufficient documentation

## 2015-11-05 DIAGNOSIS — R1011 Right upper quadrant pain: Secondary | ICD-10-CM | POA: Insufficient documentation

## 2015-11-05 DIAGNOSIS — Z3202 Encounter for pregnancy test, result negative: Secondary | ICD-10-CM | POA: Insufficient documentation

## 2015-11-05 DIAGNOSIS — R0602 Shortness of breath: Secondary | ICD-10-CM | POA: Insufficient documentation

## 2015-11-05 DIAGNOSIS — Z853 Personal history of malignant neoplasm of breast: Secondary | ICD-10-CM | POA: Insufficient documentation

## 2015-11-05 LAB — COMPREHENSIVE METABOLIC PANEL
ALBUMIN: 3.9 g/dL (ref 3.5–5.0)
ALT: 26 U/L (ref 14–54)
AST: 26 U/L (ref 15–41)
Alkaline Phosphatase: 74 U/L (ref 38–126)
Anion gap: 9 (ref 5–15)
BILIRUBIN TOTAL: 0.6 mg/dL (ref 0.3–1.2)
BUN: 13 mg/dL (ref 6–20)
CHLORIDE: 110 mmol/L (ref 101–111)
CO2: 22 mmol/L (ref 22–32)
Calcium: 9.3 mg/dL (ref 8.9–10.3)
Creatinine, Ser: 0.69 mg/dL (ref 0.44–1.00)
GFR calc Af Amer: >60 mL/min (ref >60)
GFR calc non Af Amer: >60 mL/min (ref >60)
Glucose, Bld: 107 mg/dL — ABNORMAL HIGH (ref 65–99)
POTASSIUM: 3.9 mmol/L (ref 3.5–5.1)
SODIUM: 141 mmol/L (ref 135–145)
TOTAL PROTEIN: 7.1 g/dL (ref 6.5–8.1)

## 2015-11-05 LAB — CBC
HEMATOCRIT: 41.1 % (ref 36.0–46.0)
Hemoglobin: 13.7 g/dL (ref 12.0–15.0)
MCH: 28.9 pg (ref 26.0–34.0)
MCHC: 33.3 g/dL (ref 30.0–36.0)
MCV: 86.7 fL (ref 78.0–100.0)
PLATELETS: 238 10*3/uL (ref 150–400)
RBC: 4.74 MIL/uL (ref 3.87–5.11)
RDW: 12.4 % (ref 11.5–15.5)
WBC: 4.6 10*3/uL (ref 4.0–10.5)

## 2015-11-05 LAB — POC URINE PREG, ED: PREG TEST UR: NEGATIVE

## 2015-11-05 LAB — I-STAT TROPONIN, ED
Troponin i, poc: 0 ng/mL (ref 0.00–0.08)
Troponin i, poc: 0 ng/mL (ref 0.00–0.08)

## 2015-11-05 LAB — LIPASE, BLOOD: Lipase: 36 U/L (ref 11–51)

## 2015-11-05 LAB — D-DIMER, QUANTITATIVE: D-Dimer, Quant: 0.31 ug/mL-FEU (ref 0.00–0.50)

## 2015-11-05 MED ORDER — FENTANYL CITRATE (PF) 100 MCG/2ML IJ SOLN
75.0000 ug | Freq: Once | INTRAMUSCULAR | Status: AC
  Administered 2015-11-05: 75 ug via INTRAVENOUS
  Filled 2015-11-05: qty 2

## 2015-11-05 MED ORDER — OXYCODONE-ACETAMINOPHEN 5-325 MG PO TABS: 1.0000 | ORAL_TABLET | Freq: Four times a day (QID) | ORAL | Status: DC | PRN

## 2015-11-05 MED ORDER — LORAZEPAM 0.5 MG PO TABS: 0.5000 mg | ORAL_TABLET | Freq: Three times a day (TID) | ORAL | Status: DC | PRN

## 2015-11-05 MED ORDER — IOPAMIDOL (ISOVUE-370) INJECTION 76%
INTRAVENOUS | Status: AC
  Administered 2015-11-05: 100 mL
  Filled 2015-11-05: qty 100

## 2015-11-05 NOTE — ED Notes (Signed)
Pt arrives via EMS from home with c/o R sided chest pain radiating into flank and shortness of breath starting around 0400 this morning. Pt tachypnic upon EMS arrival, c/o of tingling to bilateral hands and feet, face. EMS stroke screen negative. No meds, no access by EMS. Double mastectomy in January, no BP/sticks on R arm.

## 2015-11-05 NOTE — Discharge Instructions (Signed)
Dolor en la pared torácica °(Chest Wall Pain) °El dolor en la pared torácica se produce en los huesos y los músculos del pecho o alrededor de estos. A veces, una lesión causa este dolor. En ocasiones, la causa puede ser desconocida. Este dolor puede durar varias semanas. °INSTRUCCIONES PARA EL CUIDADO EN EL HOGAR  °Esté atento a cualquier cambio en los síntomas. Tome estas medidas para aliviar el dolor:  °· Haga reposo como se lo haya indicado el médico.   °· Evite las actividades que causan dolor. Estas pueden ser aquellas que requieren el uso de los músculos del tórax, los abdominales o los laterales para levantar objetos pesados.    °· Si se lo indican, aplique hielo sobre la zona dolorida: °¨ Ponga el hielo en una bolsa plástica. °¨ Coloque una toalla entre la piel y la bolsa de hielo. °¨ Coloque el hielo durante 20 minutos, 2 a 3 veces por día. °· Tome los medicamentos de venta libre y los recetados solamente como se lo haya indicado el médico. °· No consuma productos que contengan tabaco, incluidos cigarrillos, tabaco de mascar y cigarrillos electrónicos. Si necesita ayuda para dejar de fumar, consulte al médico. °· Concurra a todas las visitas de control como se lo haya indicado el médico. Esto es importante. °SOLICITE ATENCIÓN MÉDICA SI: °· Tiene fiebre. °· El dolor de pecho empeora. °· Aparecen nuevos síntomas. °SOLICITE ATENCIÓN MÉDICA DE INMEDIATO SI: °· Tiene náuseas o vómitos. °· Transpira o tiene sensación de desvanecimiento. °· Tiene tos con flema (esputo) o expectora sangre al toser. °· Le falta el aire. °  °Esta información no tiene como fin reemplazar el consejo del médico. Asegúrese de hacerle al médico cualquier pregunta que tenga. °  °Document Released: 08/20/2006 Document Revised: 03/30/2015 °Elsevier Interactive Patient Education ©2016 [employer] ° °

## 2015-11-05 NOTE — ED Provider Notes (Signed)
CSN: [id number]     Arrival date & time 11/05/15  [id number] History   First MD Initiated Contact with Patient 11/05/15 [phone]     Chief Complaint  Patient presents with  . Shortness of Breath  . Chest Pain     The history is provided by the patient and a relative. No language interpreter was used.   Kayla Johnston is a 50 y.o. female who presents to the Emergency Department complaining of chest pain/SOB.  She is s/p bilateral mastectomy in January for breast cancer.  Between 4 and 5 this morning she woke up suddenly with right sided chest and upper back pain. The pain is described as a pressure type sensation with associated shortness of breath. Pain is constant and severe. She feels as if she cannot take a deep breath. She has associated paresthesias of her entire face and her entire right side of her body. No fever, cough, abdominal pain, vomiting, diarrhea, or actually swelling or pain.  Past Medical History  Diagnosis Date  . Breast cancer ([place])   . Breast cancer of upper-outer quadrant of right female breast ([place]) 07/05/2015   Past Surgical History  Procedure Laterality Date  . Appendectomy    . Breast biopsy      biopsy  . Simple mastectomy w/ sentinel node biopsy Right 08/23/2015  . Simple mastectomy Left 08/23/2015  . Simple mastectomy with axillary sentinel node biopsy Right 08/23/2015    Procedure: RIGHT SIMPLE MASTECTOMY WITH RIGHT SENTINEL LYMPH  NODE MAPPING;  Surgeon: Erroll Luna, MD;  Location: [facility];  Service: General;  Laterality: Right;  . Simple mastectomy with axillary sentinel node biopsy Left 08/23/2015    Procedure: Left SIMPLE MASTECTOMY,prophylactic;  Surgeon: Erroll Luna, MD;  Location: [facility];  Service: General;  Laterality: Left;   Family History  Problem Relation Age of Onset  . Cancer Sister     cervical   Social History  Substance Use Topics  . Smoking status: Never Smoker   . Smokeless tobacco: Never Used  . Alcohol Use: No   OB History    Gravida Para Term Preterm AB TAB SAB Ectopic Multiple Living   3    3  2 1   0     Review of Systems  All other systems reviewed and are negative.     Allergies  Review of patient's allergies indicates no known allergies.  Home Medications   Prior to Admission medications   Medication Sig Start Date End Date Taking? Authorizing Provider  oxyCODONE (OXY IR/ROXICODONE) 5 MG immediate release tablet Take 1-2 tablets (5-10 mg total) by mouth every 4 (four) hours as needed for moderate pain. 09/21/15  Yes Chauncey Cruel, MD  LORazepam (ATIVAN) 0.5 MG tablet Take 1 tablet (0.5 mg total) by mouth 3 (three) times daily as needed for anxiety. 11/05/15   Quintella Reichert, MD  oxyCODONE-acetaminophen (PERCOCET/ROXICET) 5-325 MG tablet Take 1 tablet by mouth every 6 (six) hours as needed for severe pain. 11/05/15   Quintella Reichert, MD   BP 140/76 mmHg  Pulse 71  Temp(Src) 97.8 F (36.6 C) (Oral)  Resp 18  SpO2 99%  LMP  (LMP Unknown) Physical Exam  Constitutional: She is oriented to person, place, and time. She appears well-developed and well-nourished.  HENT:  Head: Normocephalic and atraumatic.  Cardiovascular: Normal rate and regular rhythm.   No murmur heard. Pulmonary/Chest: Effort normal and breath sounds normal. No respiratory distress.  Well-healed surgical scars to the chest. There is significant tenderness  to palpation throughout the right anterior chest and posterior chest.  Abdominal: Soft. There is no rebound and no guarding.  Moderate right upper quadrant tenderness  Musculoskeletal: She exhibits no edema or tenderness.  Neurological: She is alert and oriented to person, place, and time.  5/5 strength in all four extremities.  Sensation to light touch intact in all four extremities.    Skin: Skin is warm and dry.  Psychiatric:  Anxious  Nursing note and vitals reviewed.   ED Course  Procedures (including critical care time) Labs Review Labs Reviewed  COMPREHENSIVE  METABOLIC PANEL - Abnormal; Notable for the following:    Glucose, Bld 107 (*)    All other components within normal limits  CBC  LIPASE, BLOOD  D-DIMER, QUANTITATIVE (NOT AT [facility])  I-STAT TROPOININ, ED  Randolm Idol, ED  POC URINE PREG, ED    Imaging Review Dg Chest 2 View  11/05/2015  CLINICAL DATA:  50 year old female with right-sided chest pain and shortness of breath EXAM: CHEST  2 VIEW COMPARISON:  Prior chest x-ray 07/05/2015 FINDINGS: The lungs are clear and negative for focal airspace consolidation, pulmonary edema or suspicious pulmonary nodule. No pleural effusion or pneumothorax. Cardiac and mediastinal contours are within normal limits. No acute fracture or lytic or blastic osseous lesions. The visualized upper abdominal bowel gas pattern is unremarkable. Surgical clips noted in both axilla. IMPRESSION: Negative chest x-ray Electronically Signed   By: Jacqulynn Cadet M.D.   On: 11/05/2015 08:31   Ct Head Wo Contrast  11/05/2015  CLINICAL DATA:  50 year old female with history of right facial and right arm numbness. Right-sided chest pain extending in the back since 5 a.m. this morning. EXAM: CT HEAD WITHOUT CONTRAST TECHNIQUE: Contiguous axial images were obtained from the base of the skull through the vertex without intravenous contrast. COMPARISON:  No priors. FINDINGS: No acute intracranial abnormalities. Specifically, no evidence of acute intracranial hemorrhage, no definite findings of acute/subacute cerebral ischemia, no mass, mass effect, hydrocephalus or abnormal intra or extra-axial fluid collections. Visualized paranasal sinuses and mastoids are well pneumatized. No acute displaced skull fractures are identified. IMPRESSION: *No acute intracranial abnormalities. *The appearance of the brain is normal. Electronically Signed   By: Vinnie Langton M.D.   On: 11/05/2015 11:47   Ct Angio Chest/abd/pel For Dissection W And/or W/wo  11/05/2015  CLINICAL DATA:  50 year old  female with history of right-sided facial numbness and right arm numbness. Right-sided chest pain extending into the back, with acute onset at 5 a.m. this morning. EXAM: CT ANGIOGRAPHY CHEST, ABDOMEN AND PELVIS TECHNIQUE: Multidetector CT imaging through the chest, abdomen and pelvis was performed using the standard protocol during bolus administration of intravenous contrast. Multiplanar reconstructed images and MIPs were obtained and reviewed to evaluate the vascular anatomy. CONTRAST:  99 mL of Isovue 370. COMPARISON:  No priors. FINDINGS: CTA CHEST FINDINGS Mediastinum/Lymph Nodes: Precontrast images demonstrate no crescentic high attenuation associated with the wall of the aorta to suggest acute intramural hemorrhage. No evidence of aortic aneurysm or dissection. No high attenuation fluid collection in the mediastinum to suggest mediastinal hematoma. Heart size is mildly enlarged. There is no significant pericardial fluid, thickening or pericardial calcification. No pathologically enlarged mediastinal or hilar lymph nodes. Small hiatal hernia. No axillary lymphadenopathy. Lungs/Pleura: No acute consolidative airspace disease. No pleural effusions. No suspicious appearing pulmonary nodules or masses. Minimal subsegmental atelectasis or scarring in the periphery of the right lower lobe. Musculoskeletal/Soft Tissues: Status post bilateral modified radical mastectomies and left axillary  lymph node dissection. There are no aggressive appearing lytic or blastic lesions noted in the visualized portions of the skeleton. Review of the MIP images confirms the above findings. CTA ABDOMEN AND PELVIS FINDINGS Hepatobiliary: Mild diffuse low attenuation throughout the hepatic parenchyma, compatible with hepatic steatosis. No cystic or solid hepatic lesions. No intra or extrahepatic biliary ductal dilatation. Gallbladder is normal in appearance. Pancreas: In the inferior aspect of the pancreatic head there is a 7 mm low  attenuation lesion which is incompletely characterized, but favored to represent a small cystic lesion such is a pancreatic pseudocyst. No pancreatic ductal dilatation. No pancreatic or peripancreatic inflammatory changes. Spleen: Unremarkable. Adrenals/Urinary Tract: In the lateral aspect of the lower pole of the right kidney there is a heterogeneously enhancing solid mass measuring approximately 2.7 x 3.7 x 3.1 cm (axial image 158 of series 501 and coronal image 69 of series 502), highly concerning for renal cell carcinoma. This is separate from the right renal vein. This lesion appears encapsulated within Gerota's fascia, although it does make direct contact with the undersurface of the right lobe of the liver adjacent to segment 6. Left kidney and bilateral adrenal glands are normal in appearance. No hydroureteronephrosis. Urinary bladder is normal in appearance. Stomach/Bowel: The appearance of the stomach is normal. There is no pathologic dilatation of small bowel or colon. The appendix is not confidently identified may be surgically absent. Regardless, there are no inflammatory changes noted adjacent to the cecum to suggest the presence of an acute appendicitis at this time. Vascular/Lymphatic: No significant atherosclerotic disease, aneurysm or dissection identified in the abdominal or pelvic vasculature. Single renal arteries bilaterally. Celiac axis, superior mesenteric artery and inferior mesenteric artery are all widely patent. No lymphadenopathy noted in the abdomen or pelvis. Reproductive: Slight heterogeneity throughout the uterus, suggestive of multiple small fibroids. Ovaries are unremarkable in appearance. Other: No significant volume of ascites.  No pneumoperitoneum. Musculoskeletal: There are no aggressive appearing lytic or blastic lesions noted in the visualized portions of the skeleton. Review of the MIP images confirms the above findings. IMPRESSION: 1. No acute abnormality of the  thoracoabdominal aorta. Specifically, no aneurysm or dissection. 2. No acute findings in the chest, abdomen or pelvis to account for the patient's symptoms. 3. However, the patient does have a 2.7 x 3.7 x 3.1 cm enhancing right renal mass which is highly concerning for renal cell carcinoma. At this time, this does not involve the right renal vein, appears encapsulated within Gerota's fascia, and is not associated with lymphadenopathy or evidence of distal metastatic disease. Nonemergent Urologic consultation is strongly recommended in the near future. 4. Hepatic steatosis. 5. Additional incidental findings, as above. These results were called by telephone at the time of interpretation on 11/05/2015 at 12:00 pm to Dr. Quintella Reichert, who verbally acknowledged these results. Electronically Signed   By: Vinnie Langton M.D.   On: 11/05/2015 12:04   I have personally reviewed and evaluated these images and lab results as part of my medical decision-making.   EKG Interpretation   Date/Time:  Saturday November 05 2015 06:53:11 EDT Ventricular Rate:  65 PR Interval:  128 QRS Duration: 93 QT Interval:  454 QTC Calculation: 472 R Axis:   62 Text Interpretation:  Sinus rhythm RSR' in V1 or V2, probably normal  variant Confirmed by Hazle Coca [id number] on 11/05/2015 6:56:11 AM      MDM   Final diagnoses:  Chest wall pain  Renal mass, right  Patient here for evaluation of  chest, back pain, shortness of breath, tingling. Her tingling resolved after she decreased her respiratory rate. Presentation is not consistent with CVA/TIA. CTA dissection protocol was obtained given her symptoms. There is no evidence of acute dissection. She does have an incidental note of a right renal mass that is concerning for renal cell carcinoma. Discussed with patient findings of study and need for further evaluation by urology. Treating her pain with Percocet.  Discussed close outpatient follow-up and return precautions.  Quintella Reichert, MD 11/05/15 1724

## 2015-11-14 ENCOUNTER — Other Ambulatory Visit: Payer: Self-pay | Admitting: Oncology

## 2015-11-14 DIAGNOSIS — N2889 Other specified disorders of kidney and ureter: Secondary | ICD-10-CM

## 2015-11-14 NOTE — Progress Notes (Unsigned)
ICK " Wears a nearby amylase monitor was living will rule out a significant big Y denies melena outline of the development also thousand after I called Kura at home to discuss the findings in the recent CT scans, which show a right renal mass highly concerning for renal cell carcinoma. She was not N but I spoke with her husband. He tells me that the doctor in the emergency room had told him about the mass and had made a referral but they do not know to whom.  I have gone ahead and placed a referral to urology myself. I told him if he has not heard from a urologist by the end of this week to please call us back.

## 2015-11-18 ENCOUNTER — Other Ambulatory Visit: Payer: Self-pay | Admitting: Urology

## 2015-11-23 ENCOUNTER — Telehealth: Payer: Self-pay | Admitting: Oncology

## 2015-11-23 ENCOUNTER — Ambulatory Visit (HOSPITAL_BASED_OUTPATIENT_CLINIC_OR_DEPARTMENT_OTHER): Payer: Self-pay | Admitting: Oncology

## 2015-11-23 VITALS — BP 144/63 | HR 56 | Temp 97.9°F | Resp 18 | Ht 60.0 in | Wt 165.9 lb

## 2015-11-23 DIAGNOSIS — N2889 Other specified disorders of kidney and ureter: Secondary | ICD-10-CM | POA: Insufficient documentation

## 2015-11-23 DIAGNOSIS — Z170 Estrogen receptor positive status [ER+]: Secondary | ICD-10-CM

## 2015-11-23 DIAGNOSIS — C50411 Malignant neoplasm of upper-outer quadrant of right female breast: Secondary | ICD-10-CM

## 2015-11-23 DIAGNOSIS — N289 Disorder of kidney and ureter, unspecified: Secondary | ICD-10-CM

## 2015-11-23 MED ORDER — TAMOXIFEN CITRATE 20 MG PO TABS: 20.0000 mg | ORAL_TABLET | Freq: Every day | ORAL | Status: AC

## 2015-11-23 NOTE — Progress Notes (Signed)
[place]  [phone] Fax:([phone]) [phone]     ID: Kayla Johnston DOB: [birth year]  MR#: [id number]  XAJ#:[id number]  Patient Care Team: No Pcp Per Patient as PCP - General (General Practice) Erroll Luna, MD as Consulting Physician (General Surgery) Chauncey Cruel, MD as Consulting Physician (Oncology) Thea Silversmith, MD as Consulting Physician (Radiation Oncology) [place] (Diagnostic Radiology) PCP: No PCP Per Patient GYN: OTHER MD:  CHIEF COMPLAINT:  Estrogen receptor positive breast cancer  CURRENT TREATMENT:  tamoxifen   BREAST CANCER HISTORY: From the original intake note:  Kayla Johnston herself noted a new lump in the right breast sometime in September or October. She has no primary care physician , so it was not until December that she eventually brought it to medical attention.  Bilateral diagnostic mammography at [facility] 06/29/2015 with tomosynthesis from the breast composition to be category C. In the right breast upper outer quadrant there was an oval mass with indistinct margins which was palpable. There were also calcifications in the right breast at the 11:00 middle depth. There were no other mammographic findings. Ultrasonography of both breasts was performed the same day. In the left breast there was a 1 cm oval lesion at the 2:00 middle depth Ann a 1.3 cm lesion in the upper outer quadrant, both of which were felt to be probably benign but warranting repeat ultrasonography in 6 months.   In the right breast however there were multiple masses: a 1.1 cm irregular mass in the lower outer quadrant , a 0.6 cm irregular mass at the 8:00 middle depth , and a 3 cm lesion at the 11:00 middle depth , finally a benign-appearing 2.9 cm complex cyst in the right upper outer quadrant middle depth. There were internal septations.   On 06/30/2015 the patient underwent biopsy of a right breast upper outer quadrant mass as well as a biopsy of the  calcifications in the right upper quadrant middle depth. 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 This was estrogen receptor 100% positive, progesterone receptor 100% positive, with an MIB-1 of 25%, and no HER-2 amplification, the signals ratio being 1.33, and the number per cell 3.00. The area of calcifications was ductal carcinoma in situ, grade 2.   The patient's subsequent history is as detailed below  INTERVAL HISTORY: Maylynn returns today for follow-up of her breast cancer accompanied by her significant other [place]. She is here to discuss anti-estrogens.  However, on 11/05/2015 she developed right flank and back pain and went to the emergency room where a CT/angiogram of the chest was obtained. This did not show any evidence of breast cancer recurrence or pulmonary embolus but did show a right renal mass measuring 3.1 cm. This was separate from the right renal vein and appeared encapsulated although touching the right lobe of the liver. She was referred to urology and will undergo surgery under Dr. Bess Harvest 12/30/2015  REVIEW OF SYSTEMS: Kayla Johnston is still having periods. She is very sensitive on the right surgical site but also on the left, to a lesser extent. She does wear per cc. Mostly she is doing her own housework, getting out of little to take some walks but otherwise is not terribly active. She continues to have discomfort in the right flank area. A detailed review of systems today was otherwise stable  PAST MEDICAL HISTORY: Past Medical History  Diagnosis Date  . Breast cancer ([place])   . Breast cancer of upper-outer quadrant of right  female breast ([place]) 07/05/2015    PAST SURGICAL HISTORY: Past Surgical History  Procedure Laterality Date  . Appendectomy    . Breast biopsy      biopsy  . Simple mastectomy w/ sentinel node biopsy Right 08/23/2015  . Simple mastectomy Left 08/23/2015  . Simple mastectomy with axillary sentinel node biopsy  Right 08/23/2015    Procedure: RIGHT SIMPLE MASTECTOMY WITH RIGHT SENTINEL LYMPH  NODE MAPPING;  Surgeon: Erroll Luna, MD;  Location: [facility];  Service: General;  Laterality: Right;  . Simple mastectomy with axillary sentinel node biopsy Left 08/23/2015    Procedure: Left SIMPLE MASTECTOMY,prophylactic;  Surgeon: Erroll Luna, MD;  Location: [facility];  Service: General;  Laterality: Left;    FAMILY HISTORY Family History  Problem Relation Age of Onset  . Cancer Sister     cervical   the patient's father died at the age of 43 but the patient does not know the cause. The patient's mother is currently alive. She is 49 years old as of December 2016. The patient has 4 brothers and 4 sisters. There is no history of breast or ovarian cancer in the family to her knowledge  GYNECOLOGIC HISTORY:  Patient's last menstrual period was 11/05/2015.  menarche age 50. The patient is GX P0. She is still having regular periods, which last approximately 3-4 days and are scant. She is not having hot flashes or vaginal dryness problems. She is not interested in fertility preservation  SOCIAL HISTORY:   the patient is single.  She is not employed. She is not an [place] speaker. Her significant other, [facility],  Is also at the home , as is his brother Building control surveyor.   They work at [employer] and odd jobs.    ADVANCED DIRECTIVES:  Not in place. At the initial clinic visit  the patient was given the appropriate forms to complete and notarize at her discretion.  She tells me she intends to name Vicki Mallet as her healthcare power of attorney. He can be reached at [employer]: Social History  Substance Use Topics  . Smoking status: Never Smoker   . Smokeless tobacco: Never Used  . Alcohol Use: No     Colonoscopy:  PAP: 2013?  Bone density:  Lipid panel:  No Known Allergies  Current Outpatient Prescriptions  Medication Sig Dispense Refill  . LORazepam (ATIVAN) 0.5 MG tablet Take 1  tablet (0.5 mg total) by mouth 3 (three) times daily as needed for anxiety. 9 tablet 0  . oxyCODONE (OXY IR/ROXICODONE) 5 MG immediate release tablet Take 1-2 tablets (5-10 mg total) by mouth every 4 (four) hours as needed for moderate pain. 30 tablet 0  . oxyCODONE-acetaminophen (PERCOCET/ROXICET) 5-325 MG tablet Take 1 tablet by mouth every 6 (six) hours as needed for severe pain. 20 tablet 0  . [DISCONTINUED] cetirizine (ZYRTEC) 10 MG tablet Take 1 tablet (10 mg total) by mouth daily. (Patient not taking: Reported on 06/25/2015) 30 tablet 11  . [DISCONTINUED] ranitidine (ZANTAC) 150 MG tablet Take 1 tablet (150 mg total) by mouth 2 (two) times daily. 60 tablet 0   No current facility-administered medications for this visit.    OBJECTIVE:  Middle-aged Spanish speaker whoAppears stated age Danley Danker Vitals:   11/23/15 1129  BP: 144/63  Pulse: 56  Temp: 97.9 F (36.6 C)  Resp: 18     Body mass index is 32.4 kg/(m^2).    ECOG FS:1 - Symptomatic but completely ambulatory  Sclerae unicteric, EOMs intact Oropharynx clear,  dentition in good repair No cervical or supraclavicular adenopathy Lungs no rales or rhonchi Heart regular rate and rhythm Abd soft, nontender, positive bowel sounds MSK no focal spinal tenderness, no upper extremity lymphedema Neuro: nonfocal, well oriented, appropriate affect Breasts: Status post bilateral mastectomies. There is some tenderness to palpation bilaterally, right greater than left, but no evidence of disease recurrence. Both axillae are benign.     LAB RESULTS:  CMP     Component Value Date/Time   NA 141 11/05/2015 0834   NA 140 09/21/2015 1032   K 3.9 11/05/2015 0834   K 3.9 09/21/2015 1032   CL 110 11/05/2015 0834   CO2 22 11/05/2015 0834   CO2 26 09/21/2015 1032   GLUCOSE 107* 11/05/2015 0834   GLUCOSE 137 09/21/2015 1032   BUN 13 11/05/2015 0834   BUN 8.7 09/21/2015 1032   CREATININE 0.69 11/05/2015 0834   CREATININE 0.8 09/21/2015 1032    CALCIUM 9.3 11/05/2015 0834   CALCIUM 9.1 09/21/2015 1032   PROT 7.1 11/05/2015 0834   PROT 7.0 09/21/2015 1032   ALBUMIN 3.9 11/05/2015 0834   ALBUMIN 3.6 09/21/2015 1032   AST 26 11/05/2015 0834   AST 39* 09/21/2015 1032   ALT 26 11/05/2015 0834   ALT 65* 09/21/2015 1032   ALKPHOS 74 11/05/2015 0834   ALKPHOS 89 09/21/2015 1032   BILITOT 0.6 11/05/2015 0834   BILITOT 0.42 09/21/2015 1032   GFRNONAA >60 11/05/2015 0834   GFRAA >60 11/05/2015 0834    INo results found for: SPEP, UPEP  Lab Results  Component Value Date   WBC 4.6 11/05/2015   NEUTROABS 2.7 09/21/2015   HGB 13.7 11/05/2015   HCT 41.1 11/05/2015   MCV 86.7 11/05/2015   PLT 238 11/05/2015      Chemistry      Component Value Date/Time   NA 141 11/05/2015 0834   NA 140 09/21/2015 1032   K 3.9 11/05/2015 0834   K 3.9 09/21/2015 1032   CL 110 11/05/2015 0834   CO2 22 11/05/2015 0834   CO2 26 09/21/2015 1032   BUN 13 11/05/2015 0834   BUN 8.7 09/21/2015 1032   CREATININE 0.69 11/05/2015 0834   CREATININE 0.8 09/21/2015 1032      Component Value Date/Time   CALCIUM 9.3 11/05/2015 0834   CALCIUM 9.1 09/21/2015 1032   ALKPHOS 74 11/05/2015 0834   ALKPHOS 89 09/21/2015 1032   AST 26 11/05/2015 0834   AST 39* 09/21/2015 1032   ALT 26 11/05/2015 0834   ALT 65* 09/21/2015 1032   BILITOT 0.6 11/05/2015 0834   BILITOT 0.42 09/21/2015 1032       No results found for: LABCA2  No components found for: LABCA125  No results for input(s): INR in the last 168 hours.  Urinalysis    Component Value Date/Time   BILIRUBINUR neg 10/09/2011 1848   PROTEINUR trace 10/09/2011 1848   UROBILINOGEN 0.2 10/09/2011 1848   NITRITE neg 10/09/2011 1848   LEUKOCYTESUR Negative 10/09/2011 1848    STUDIES: Dg Chest 2 View  11/05/2015  CLINICAL DATA:  50 year old female with right-sided chest pain and shortness of breath EXAM: CHEST  2 VIEW COMPARISON:  Prior chest x-ray 07/05/2015 FINDINGS: The lungs are clear and  negative for focal airspace consolidation, pulmonary edema or suspicious pulmonary nodule. No pleural effusion or pneumothorax. Cardiac and mediastinal contours are within normal limits. No acute fracture or lytic or blastic osseous lesions. The visualized upper abdominal bowel gas pattern is unremarkable.  Surgical clips noted in both axilla. IMPRESSION: Negative chest x-ray Electronically Signed   By: Jacqulynn Cadet M.D.   On: 11/05/2015 08:31   Ct Head Wo Contrast  11/05/2015  CLINICAL DATA:  50 year old female with history of right facial and right arm numbness. Right-sided chest pain extending in the back since 5 a.m. this morning. EXAM: CT HEAD WITHOUT CONTRAST TECHNIQUE: Contiguous axial images were obtained from the base of the skull through the vertex without intravenous contrast. COMPARISON:  No priors. FINDINGS: No acute intracranial abnormalities. Specifically, no evidence of acute intracranial hemorrhage, no definite findings of acute/subacute cerebral ischemia, no mass, mass effect, hydrocephalus or abnormal intra or extra-axial fluid collections. Visualized paranasal sinuses and mastoids are well pneumatized. No acute displaced skull fractures are identified. IMPRESSION: *No acute intracranial abnormalities. *The appearance of the brain is normal. Electronically Signed   By: Vinnie Langton M.D.   On: 11/05/2015 11:47   Ct Angio Chest/abd/pel For Dissection W And/or W/wo  11/05/2015  CLINICAL DATA:  50 year old female with history of right-sided facial numbness and right arm numbness. Right-sided chest pain extending into the back, with acute onset at 5 a.m. this morning. EXAM: CT ANGIOGRAPHY CHEST, ABDOMEN AND PELVIS TECHNIQUE: Multidetector CT imaging through the chest, abdomen and pelvis was performed using the standard protocol during bolus administration of intravenous contrast. Multiplanar reconstructed images and MIPs were obtained and reviewed to evaluate the vascular anatomy.  CONTRAST:  99 mL of Isovue 370. COMPARISON:  No priors. FINDINGS: CTA CHEST FINDINGS Mediastinum/Lymph Nodes: Precontrast images demonstrate no crescentic high attenuation associated with the wall of the aorta to suggest acute intramural hemorrhage. No evidence of aortic aneurysm or dissection. No high attenuation fluid collection in the mediastinum to suggest mediastinal hematoma. Heart size is mildly enlarged. There is no significant pericardial fluid, thickening or pericardial calcification. No pathologically enlarged mediastinal or hilar lymph nodes. Small hiatal hernia. No axillary lymphadenopathy. Lungs/Pleura: No acute consolidative airspace disease. No pleural effusions. No suspicious appearing pulmonary nodules or masses. Minimal subsegmental atelectasis or scarring in the periphery of the right lower lobe. Musculoskeletal/Soft Tissues: Status post bilateral modified radical mastectomies and left axillary lymph node dissection. There are no aggressive appearing lytic or blastic lesions noted in the visualized portions of the skeleton. Review of the MIP images confirms the above findings. CTA ABDOMEN AND PELVIS FINDINGS Hepatobiliary: Mild diffuse low attenuation throughout the hepatic parenchyma, compatible with hepatic steatosis. No cystic or solid hepatic lesions. No intra or extrahepatic biliary ductal dilatation. Gallbladder is normal in appearance. Pancreas: In the inferior aspect of the pancreatic head there is a 7 mm low attenuation lesion which is incompletely characterized, but favored to represent a small cystic lesion such is a pancreatic pseudocyst. No pancreatic ductal dilatation. No pancreatic or peripancreatic inflammatory changes. Spleen: Unremarkable. Adrenals/Urinary Tract: In the lateral aspect of the lower pole of the right kidney there is a heterogeneously enhancing solid mass measuring approximately 2.7 x 3.7 x 3.1 cm (axial image 158 of series 501 and coronal image 69 of series 502),  highly concerning for renal cell carcinoma. This is separate from the right renal vein. This lesion appears encapsulated within Gerota's fascia, although it does make direct contact with the undersurface of the right lobe of the liver adjacent to segment 6. Left kidney and bilateral adrenal glands are normal in appearance. No hydroureteronephrosis. Urinary bladder is normal in appearance. Stomach/Bowel: The appearance of the stomach is normal. There is no pathologic dilatation of small bowel or colon.  The appendix is not confidently identified may be surgically absent. Regardless, there are no inflammatory changes noted adjacent to the cecum to suggest the presence of an acute appendicitis at this time. Vascular/Lymphatic: No significant atherosclerotic disease, aneurysm or dissection identified in the abdominal or pelvic vasculature. Single renal arteries bilaterally. Celiac axis, superior mesenteric artery and inferior mesenteric artery are all widely patent. No lymphadenopathy noted in the abdomen or pelvis. Reproductive: Slight heterogeneity throughout the uterus, suggestive of multiple small fibroids. Ovaries are unremarkable in appearance. Other: No significant volume of ascites.  No pneumoperitoneum. Musculoskeletal: There are no aggressive appearing lytic or blastic lesions noted in the visualized portions of the skeleton. Review of the MIP images confirms the above findings. IMPRESSION: 1. No acute abnormality of the thoracoabdominal aorta. Specifically, no aneurysm or dissection. 2. No acute findings in the chest, abdomen or pelvis to account for the patient's symptoms. 3. However, the patient does have a 2.7 x 3.7 x 3.1 cm enhancing right renal mass which is highly concerning for renal cell carcinoma. At this time, this does not involve the right renal vein, appears encapsulated within Gerota's fascia, and is not associated with lymphadenopathy or evidence of distal metastatic disease. Nonemergent  Urologic consultation is strongly recommended in the near future. 4. Hepatic steatosis. 5. Additional incidental findings, as above. These results were called by telephone at the time of interpretation on 11/05/2015 at 12:00 pm to Dr. Quintella Reichert, who verbally acknowledged these results. Electronically Signed   By: Vinnie Langton M.D.   On: 11/05/2015 12:04    ASSESSMENT: 50 y.o.  [place] speaker status post biopsy of 2 areas in the right breast 06/30/2015, showing invasive ductal carcinoma , estrogen and progesterone receptor positive, HER-2 not amplified, with an MIB-1 of 25%, and ductal carcinoma in situ    (1) status post bilateral mastectomies 08/23/2015 for a right-sidedpT1b pN0, stage IA invasive ductal carcinoma, with HER-2 again negative. Margins were negative.  (2)  The patient will not need adjuvant chemotherapy or adjuvant radiation.  (3) tamoxifen started 11/23/2015  (4) right renal mass noted incidentally on CT scan of the abdomen April 2017  PLAN: Nonna has recovered well from her surgery. She is still somewhat tender at the surgical sites, and she will be using Tylenol and Aleve for this on an as-needed basis this should get better with time. It is a little early to start her on a stretching program.  Today we discussed tamoxifen. As she is still menstruating that is really the only choice for anti-estrogens in her case. She is aware of the mechanism of action and also the possible toxicities, side effects and complications, which in her case likely will be primarily hot flashes. I gave her written prescription as well as putting the prescription directly to her pharmacy so she can "shop it" for the best price.  She already has an appointment for surgery with urology in about a month. I'm going to see her 2 months from now. She will have had her surgery then we will have the pathology report.  She knows to call for any problems that may develop before her next visit  here. Chauncey Cruel, MD   11/23/2015 11:45 AM Medical Oncology and Hematology [facility] [address] [place], [place] [address] Tel. [phone]    Fax. [phone]

## 2015-11-23 NOTE — Telephone Encounter (Signed)
appt made and avs printed °

## 2015-12-21 NOTE — Progress Notes (Signed)
CHEST XRAY 11-05-15 EPIC EKG 11-05-15 EPIC

## 2015-12-21 NOTE — Patient Instructions (Addendum)
Kayla Johnston  12/21/2015   Your procedure is scheduled on: 12-30-15  Report to [facility] Main  Entrance take [facility]  elevators to 3rd floor to  [place] at 1015 AM.  Call this number if you have problems the morning of surgery [phone]   Remember: ONLY 1 PERSON MAY GO WITH YOU TO SHORT STAY TO GET  READY MORNING OF [place].  Do not eat food :After Midnight, Wednesday NIGHT CLEAR LIQUIDS ALL DAY Thursday 12-29-15.  FOLLOW ANY BOWEL PREP INSTRUCTIONS AND CLEAR LIQUIDS FROM DR [facility] . NOTHING TO DRINK AFTER MIDNIGHT NIGHT BEFORE SURGERY.   Take these medicines the morning of surgery with A SIP OF WATER: LORAZEPAM (ATIVAN ) IF NEEDED, TAMOFIFEN (NOLVADEX)                               You may not have any metal on your body including hair pins and              piercings  Do not wear jewelry, make-up, lotions, powders or perfumes, deodorant             Do not wear nail polish.  Do not shave  48 hours prior to surgery.              Men may shave face and neck.   Do not bring valuables to the hospital. [place].  Contacts, dentures or bridgework may not be worn into surgery.  Leave suitcase in the car. After surgery it may be brought to your room.                  Please read over the following fact sheets you were given: _____________________________________________________________________             [facility] - Preparing for Surgery Before surgery, you can play an important role.  Because skin is not sterile, your skin needs to be as free of germs as possible.  You can reduce the number of germs on your skin by washing with CHG (chlorahexidine gluconate) soap before surgery.  CHG is an antiseptic cleaner which kills germs and bonds with the skin to continue killing germs even after washing. Please DO NOT use if you have an allergy to CHG or antibacterial soaps.  If your skin becomes  reddened/irritated stop using the CHG and inform your nurse when you arrive at Short Stay. Do not shave (including legs and underarms) for at least 48 hours prior to the first CHG shower.  You may shave your face/neck. Please follow these instructions carefully:  1.  Shower with CHG Soap the night before surgery and the  morning of Surgery.  2.  If you choose to wash your hair, wash your hair first as usual with your  normal  shampoo.  3.  After you shampoo, rinse your hair and body thoroughly to remove the  shampoo.                           4.  Use CHG as you would any other liquid soap.  You can apply chg directly  to the skin and wash  Gently with a scrungie or clean washcloth.  5.  Apply the CHG Soap to your body ONLY FROM THE NECK DOWN.   Do not use on face/ open                           Wound or open sores. Avoid contact with eyes, ears mouth and genitals (private parts).                       Wash face,  Genitals (private parts) with your normal soap.             6.  Wash thoroughly, paying special attention to the area where your surgery  will be performed.  7.  Thoroughly rinse your body with warm water from the neck down.  8.  DO NOT shower/wash with your normal soap after using and rinsing off  the CHG Soap.                9.  Pat yourself dry with a clean towel.            10.  Wear clean pajamas.            11.  Place clean sheets on your bed the night of your first shower and do not  sleep with pets. Day of Surgery : Do not apply any lotions/deodorants the morning of surgery.  Please wear clean clothes to the hospital/surgery center.  FAILURE TO FOLLOW THESE INSTRUCTIONS MAY RESULT IN THE CANCELLATION OF YOUR SURGERY PATIENT SIGNATURE_________________________________  NURSE SIGNATURE__________________________________  ________________________________________________________________________  WHAT IS A BLOOD TRANSFUSION? Blood Transfusion Information  A  transfusion is the replacement of blood or some of its parts. Blood is made up of multiple cells which provide different functions.  Red blood cells carry oxygen and are used for blood loss replacement.  White blood cells fight against infection.  Platelets control bleeding.  Plasma helps clot blood.  Other blood products are available for specialized needs, such as hemophilia or other clotting disorders. BEFORE THE TRANSFUSION  Who gives blood for transfusions?   Healthy volunteers who are fully evaluated to make sure their blood is safe. This is blood bank blood. Transfusion therapy is the safest it has ever been in the practice of medicine. Before blood is taken from a donor, a complete history is taken to make sure that person has no history of diseases nor engages in risky social behavior (examples are intravenous drug use or sexual activity with multiple partners). The donor's travel history is screened to minimize risk of transmitting infections, such as malaria. The donated blood is tested for signs of infectious diseases, such as HIV and hepatitis. The blood is then tested to be sure it is compatible with you in order to minimize the chance of a transfusion reaction. If you or a relative donates blood, this is often done in anticipation of surgery and is not appropriate for emergency situations. It takes many days to process the donated blood. RISKS AND COMPLICATIONS Although transfusion therapy is very safe and saves many lives, the main dangers of transfusion include:   Getting an infectious disease.  Developing a transfusion reaction. This is an allergic reaction to something in the blood you were given. Every precaution is taken to prevent this. The decision to have a blood transfusion has been considered carefully by your caregiver before blood is given. Blood is not given unless the benefits outweigh  the risks. AFTER THE TRANSFUSION  Right after receiving a blood transfusion,  you will usually feel much better and more energetic. This is especially true if your red blood cells have gotten low (anemic). The transfusion raises the level of the red blood cells which carry oxygen, and this usually causes an energy increase.  The nurse administering the transfusion will monitor you carefully for complications. HOME CARE INSTRUCTIONS  No special instructions are needed after a transfusion. You may find your energy is better. Speak with your caregiver about any limitations on activity for underlying diseases you may have. SEEK MEDICAL CARE IF:   Your condition is not improving after your transfusion.  You develop redness or irritation at the intravenous (IV) site. SEEK IMMEDIATE MEDICAL CARE IF:  Any of the following symptoms occur over the next 12 hours:  Shaking chills.  You have a temperature by mouth above 102 F (38.9 C), not controlled by medicine.  Chest, back, or muscle pain.  People around you feel you are not acting correctly or are confused.  Shortness of breath or difficulty breathing.  Dizziness and fainting.  You get a rash or develop hives.  You have a decrease in urine output.  Your urine turns a dark color or changes to pink, red, or brown. Any of the following symptoms occur over the next 10 days:  You have a temperature by mouth above 102 F (38.9 C), not controlled by medicine.  Shortness of breath.  Weakness after normal activity.  The white part of the eye turns yellow (jaundice).  You have a decrease in the amount of urine or are urinating less often.  Your urine turns a dark color or changes to pink, red, or brown. Document Released: 07/06/2000 Document Revised: 10/01/2011 Document Reviewed: 02/23/2008 [facility] Patient Information 2014 [place], [place].  _______________________________________________________________________

## 2015-12-22 ENCOUNTER — Encounter: Payer: Self-pay | Admitting: Nurse Practitioner

## 2015-12-22 ENCOUNTER — Encounter (HOSPITAL_COMMUNITY): Payer: Self-pay

## 2015-12-22 ENCOUNTER — Encounter (HOSPITAL_COMMUNITY)
Admission: RE | Admit: 2015-12-22 | Discharge: 2015-12-22 | Disposition: A | Discharge status: Home / Self Care | Payer: Self-pay | Source: Ambulatory Visit | Attending: Urology | Admitting: Urology

## 2015-12-22 DIAGNOSIS — Z0183 Encounter for blood typing: Secondary | ICD-10-CM | POA: Insufficient documentation

## 2015-12-22 DIAGNOSIS — N2889 Other specified disorders of kidney and ureter: Secondary | ICD-10-CM | POA: Insufficient documentation

## 2015-12-22 DIAGNOSIS — Z01812 Encounter for preprocedural laboratory examination: Secondary | ICD-10-CM | POA: Insufficient documentation

## 2015-12-22 HISTORY — DX: Other injury of unspecified body region, initial encounter: T14.8XXA

## 2015-12-22 HISTORY — DX: Other specified disorders of kidney and ureter: N28.89

## 2015-12-22 LAB — CBC
HEMATOCRIT: 37.5 % (ref 36.0–46.0)
HEMOGLOBIN: 12.8 g/dL (ref 12.0–15.0)
MCH: 28.7 pg (ref 26.0–34.0)
MCHC: 34.1 g/dL (ref 30.0–36.0)
MCV: 84.1 fL (ref 78.0–100.0)
Platelets: 185 10*3/uL (ref 150–400)
RBC: 4.46 MIL/uL (ref 3.87–5.11)
RDW: 12.6 % (ref 11.5–15.5)
WBC: 5.4 10*3/uL (ref 4.0–10.5)

## 2015-12-22 LAB — ABO/RH: ABO/RH(D): O POS

## 2015-12-22 LAB — BASIC METABOLIC PANEL
ANION GAP: 7 (ref 5–15)
BUN: 14 mg/dL (ref 6–20)
CO2: 28 mmol/L (ref 22–32)
Calcium: 8.7 mg/dL — ABNORMAL LOW (ref 8.9–10.3)
Chloride: 106 mmol/L (ref 101–111)
Creatinine, Ser: 0.7 mg/dL (ref 0.44–1.00)
GLUCOSE: 95 mg/dL (ref 65–99)
POTASSIUM: 3.7 mmol/L (ref 3.5–5.1)
Sodium: 141 mmol/L (ref 135–145)

## 2015-12-22 LAB — HCG, SERUM, QUALITATIVE: Preg, Serum: NEGATIVE

## 2015-12-22 NOTE — Progress Notes (Signed)
email confirmation for spanish interpreter received and placed on chart for day of surgery amanda benbassat to interpret per email.

## 2015-12-22 NOTE — Progress Notes (Signed)
Bowel prep instructions faxed from [facility] and reviewed with patient, clear liquids all day 12-29-15, bottle of magnesium citrate at 1200 pm noon on 12-29-15, npo after midnight night before surgery.

## 2015-12-23 ENCOUNTER — Encounter: Payer: Self-pay | Admitting: Nurse Practitioner

## 2015-12-30 ENCOUNTER — Encounter (HOSPITAL_COMMUNITY): Payer: Self-pay | Admitting: *Deleted

## 2015-12-30 ENCOUNTER — Inpatient Hospital Stay (HOSPITAL_COMMUNITY)
Admission: RE | Admit: 2015-12-30 | Discharge: 2016-01-02 | DRG: 658 | Disposition: A | Discharge status: Home / Self Care | Payer: Self-pay | Source: Ambulatory Visit | Attending: Urology | Admitting: Urology

## 2015-12-30 ENCOUNTER — Encounter (HOSPITAL_COMMUNITY)
Admission: RE | Disposition: A | Discharge status: Home / Self Care | Payer: Self-pay | Source: Ambulatory Visit | Attending: Urology

## 2015-12-30 ENCOUNTER — Inpatient Hospital Stay (HOSPITAL_COMMUNITY): Payer: Self-pay | Admitting: Anesthesiology

## 2015-12-30 DIAGNOSIS — Z8049 Family history of malignant neoplasm of other genital organs: Secondary | ICD-10-CM

## 2015-12-30 DIAGNOSIS — Z9013 Acquired absence of bilateral breasts and nipples: Secondary | ICD-10-CM

## 2015-12-30 DIAGNOSIS — Z6831 Body mass index (BMI) 31.0-31.9, adult: Secondary | ICD-10-CM

## 2015-12-30 DIAGNOSIS — C641 Malignant neoplasm of right kidney, except renal pelvis: Principal | ICD-10-CM | POA: Diagnosis present

## 2015-12-30 DIAGNOSIS — R110 Nausea: Secondary | ICD-10-CM | POA: Diagnosis not present

## 2015-12-30 DIAGNOSIS — E669 Obesity, unspecified: Secondary | ICD-10-CM | POA: Diagnosis present

## 2015-12-30 DIAGNOSIS — N2889 Other specified disorders of kidney and ureter: Secondary | ICD-10-CM | POA: Diagnosis present

## 2015-12-30 DIAGNOSIS — Z853 Personal history of malignant neoplasm of breast: Secondary | ICD-10-CM

## 2015-12-30 HISTORY — PX: ROBOTIC ASSITED PARTIAL NEPHRECTOMY: SHX6087

## 2015-12-30 LAB — TYPE AND SCREEN
ABO/RH(D): O POS
ANTIBODY SCREEN: NEGATIVE

## 2015-12-30 LAB — HEMOGLOBIN AND HEMATOCRIT, BLOOD
HEMATOCRIT: 35.4 % — AB (ref 36.0–46.0)
HEMOGLOBIN: 11.9 g/dL — AB (ref 12.0–15.0)

## 2015-12-30 SURGERY — NEPHRECTOMY, PARTIAL, ROBOT-ASSISTED
Anesthesia: General | Laterality: Right

## 2015-12-30 MED ORDER — FENTANYL CITRATE (PF) 100 MCG/2ML IJ SOLN
INTRAMUSCULAR | Status: DC | PRN
  Administered 2015-12-30 (×5): 50 ug via INTRAVENOUS
  Administered 2015-12-30: 25 ug via INTRAVENOUS
  Administered 2015-12-30: 50 ug via INTRAVENOUS
  Administered 2015-12-30: 25 ug via INTRAVENOUS
  Administered 2015-12-30 (×2): 50 ug via INTRAVENOUS

## 2015-12-30 MED ORDER — PROPOFOL 10 MG/ML IV BOLUS
INTRAVENOUS | Status: AC
  Filled 2015-12-30: qty 20

## 2015-12-30 MED ORDER — LIDOCAINE HCL (CARDIAC) 20 MG/ML IV SOLN
INTRAVENOUS | Status: AC
  Filled 2015-12-30: qty 5

## 2015-12-30 MED ORDER — LIDOCAINE HCL (CARDIAC) 20 MG/ML IV SOLN
INTRAVENOUS | Status: DC | PRN
  Administered 2015-12-30: 75 mg via INTRAVENOUS

## 2015-12-30 MED ORDER — HYDROMORPHONE HCL 1 MG/ML IJ SOLN
0.5000 mg | INTRAMUSCULAR | Status: DC | PRN
  Administered 2015-12-30 – 2016-01-02 (×10): 1 mg via INTRAVENOUS
  Filled 2015-12-30 (×11): qty 1

## 2015-12-30 MED ORDER — LACTATED RINGERS IR SOLN
Status: DC | PRN
Start: 2015-12-30 — End: 2015-12-30
  Administered 2015-12-30: 1000 mL

## 2015-12-30 MED ORDER — ROCURONIUM BROMIDE 100 MG/10ML IV SOLN
INTRAVENOUS | Status: DC | PRN
  Administered 2015-12-30: 10 mg via INTRAVENOUS
  Administered 2015-12-30: 50 mg via INTRAVENOUS
  Administered 2015-12-30: 20 mg via INTRAVENOUS

## 2015-12-30 MED ORDER — METOCLOPRAMIDE HCL 5 MG/ML IJ SOLN
INTRAMUSCULAR | Status: DC | PRN
  Administered 2015-12-30: 5 mg via INTRAVENOUS

## 2015-12-30 MED ORDER — SODIUM CHLORIDE 0.9 % IJ SOLN
INTRAMUSCULAR | Status: AC
Start: 2015-12-30 — End: 2015-12-30
  Filled 2015-12-30: qty 10

## 2015-12-30 MED ORDER — LACTATED RINGERS IV SOLN
INTRAVENOUS | Status: DC | PRN
  Administered 2015-12-30 (×2): via INTRAVENOUS

## 2015-12-30 MED ORDER — MAGNESIUM CITRATE PO SOLN
1.0000 | Freq: Once | ORAL | Status: DC
  Filled 2015-12-30: qty 296

## 2015-12-30 MED ORDER — PROMETHAZINE HCL 25 MG/ML IJ SOLN
6.2500 mg | INTRAMUSCULAR | Status: DC | PRN
  Administered 2015-12-30: 6.25 mg via INTRAVENOUS

## 2015-12-30 MED ORDER — DEXAMETHASONE SODIUM PHOSPHATE 10 MG/ML IJ SOLN
INTRAMUSCULAR | Status: AC
  Filled 2015-12-30: qty 1

## 2015-12-30 MED ORDER — SUGAMMADEX SODIUM 200 MG/2ML IV SOLN
INTRAVENOUS | Status: AC
  Filled 2015-12-30: qty 4

## 2015-12-30 MED ORDER — ONDANSETRON HCL 4 MG/2ML IJ SOLN
INTRAMUSCULAR | Status: AC
  Filled 2015-12-30: qty 2

## 2015-12-30 MED ORDER — ONDANSETRON HCL 4 MG/2ML IJ SOLN
INTRAMUSCULAR | Status: DC | PRN
  Administered 2015-12-30: 4 mg via INTRAVENOUS

## 2015-12-30 MED ORDER — EPHEDRINE SULFATE 50 MG/ML IJ SOLN
INTRAMUSCULAR | Status: AC
  Filled 2015-12-30: qty 1

## 2015-12-30 MED ORDER — MANNITOL 25 % IV SOLN
INTRAVENOUS | Status: AC
  Filled 2015-12-30: qty 100

## 2015-12-30 MED ORDER — ATROPINE SULFATE 0.4 MG/ML IJ SOLN
INTRAMUSCULAR | Status: AC
  Filled 2015-12-30: qty 1

## 2015-12-30 MED ORDER — PROPOFOL 10 MG/ML IV BOLUS
INTRAVENOUS | Status: DC | PRN
  Administered 2015-12-30: 150 mg via INTRAVENOUS

## 2015-12-30 MED ORDER — DIPHENHYDRAMINE HCL 50 MG/ML IJ SOLN: 12.5000 mg | Freq: Four times a day (QID) | INTRAMUSCULAR | Status: DC | PRN

## 2015-12-30 MED ORDER — DIPHENHYDRAMINE HCL 12.5 MG/5ML PO ELIX: 12.5000 mg | ORAL_SOLUTION | Freq: Four times a day (QID) | ORAL | Status: DC | PRN

## 2015-12-30 MED ORDER — METOCLOPRAMIDE HCL 5 MG/ML IJ SOLN
INTRAMUSCULAR | Status: AC
  Filled 2015-12-30: qty 2

## 2015-12-30 MED ORDER — TAMOXIFEN CITRATE 20 MG PO TABS: 20.0000 mg | ORAL_TABLET | Freq: Every day | ORAL | Status: DC

## 2015-12-30 MED ORDER — FENTANYL CITRATE (PF) 100 MCG/2ML IJ SOLN
INTRAMUSCULAR | Status: AC
  Filled 2015-12-30: qty 2

## 2015-12-30 MED ORDER — SODIUM CHLORIDE 0.9 % IJ SOLN
INTRAMUSCULAR | Status: AC
  Filled 2015-12-30: qty 20

## 2015-12-30 MED ORDER — MANNITOL 25 % IV SOLN
INTRAVENOUS | Status: DC | PRN
  Administered 2015-12-30 (×2): 50 mL via INTRAVENOUS

## 2015-12-30 MED ORDER — HYDROCODONE-ACETAMINOPHEN 5-325 MG PO TABS: 1.0000 | ORAL_TABLET | Freq: Four times a day (QID) | ORAL | Status: DC | PRN

## 2015-12-30 MED ORDER — CEFAZOLIN SODIUM-DEXTROSE 2-4 GM/100ML-% IV SOLN
INTRAVENOUS | Status: AC
  Filled 2015-12-30: qty 100

## 2015-12-30 MED ORDER — FENTANYL CITRATE (PF) 100 MCG/2ML IJ SOLN
25.0000 ug | INTRAMUSCULAR | Status: DC | PRN
  Administered 2015-12-30 (×2): 50 ug via INTRAVENOUS

## 2015-12-30 MED ORDER — DEXAMETHASONE SODIUM PHOSPHATE 10 MG/ML IJ SOLN
INTRAMUSCULAR | Status: DC | PRN
  Administered 2015-12-30: 10 mg via INTRAVENOUS

## 2015-12-30 MED ORDER — CEFAZOLIN SODIUM-DEXTROSE 2-4 GM/100ML-% IV SOLN
2.0000 g | INTRAVENOUS | Status: AC
  Administered 2015-12-30: 2 g via INTRAVENOUS
  Filled 2015-12-30: qty 100

## 2015-12-30 MED ORDER — ACETAMINOPHEN 500 MG PO TABS
1000.0000 mg | ORAL_TABLET | Freq: Four times a day (QID) | ORAL | Status: AC
  Administered 2015-12-30 – 2015-12-31 (×3): 1000 mg via ORAL
  Filled 2015-12-30 (×3): qty 2

## 2015-12-30 MED ORDER — LACTATED RINGERS IV SOLN
INTRAVENOUS | Status: DC
  Administered 2015-12-30: 1000 mL via INTRAVENOUS

## 2015-12-30 MED ORDER — DEXTROSE-NACL 5-0.45 % IV SOLN
INTRAVENOUS | Status: DC
  Administered 2015-12-30 – 2016-01-02 (×4): via INTRAVENOUS

## 2015-12-30 MED ORDER — BUPIVACAINE LIPOSOME 1.3 % IJ SUSP
INTRAMUSCULAR | Status: DC | PRN
  Administered 2015-12-30: 40 mL

## 2015-12-30 MED ORDER — ROCURONIUM BROMIDE 100 MG/10ML IV SOLN
INTRAVENOUS | Status: AC
Start: 2015-12-30 — End: 2015-12-30
  Filled 2015-12-30: qty 1

## 2015-12-30 MED ORDER — FENTANYL CITRATE (PF) 250 MCG/5ML IJ SOLN
INTRAMUSCULAR | Status: AC
  Filled 2015-12-30: qty 5

## 2015-12-30 MED ORDER — PROMETHAZINE HCL 25 MG/ML IJ SOLN
INTRAMUSCULAR | Status: AC
Start: 2015-12-30 — End: 2015-12-31
  Filled 2015-12-30: qty 1

## 2015-12-30 MED ORDER — MIDAZOLAM HCL 5 MG/5ML IJ SOLN
INTRAMUSCULAR | Status: DC | PRN
  Administered 2015-12-30 (×2): 0.5 mg via INTRAVENOUS
  Administered 2015-12-30: 1 mg via INTRAVENOUS

## 2015-12-30 MED ORDER — ONDANSETRON HCL 4 MG/2ML IJ SOLN
4.0000 mg | INTRAMUSCULAR | Status: DC | PRN
  Administered 2015-12-30 – 2016-01-01 (×7): 4 mg via INTRAVENOUS
  Filled 2015-12-30 (×8): qty 2

## 2015-12-30 MED ORDER — BUPIVACAINE LIPOSOME 1.3 % IJ SUSP
20.0000 mL | Freq: Once | INTRAMUSCULAR | Status: DC
  Filled 2015-12-30: qty 20

## 2015-12-30 MED ORDER — OXYCODONE HCL 5 MG PO TABS
5.0000 mg | ORAL_TABLET | ORAL | Status: DC | PRN
  Administered 2015-12-31 – 2016-01-02 (×2): 5 mg via ORAL
  Filled 2015-12-30 (×2): qty 1

## 2015-12-30 MED ORDER — MIDAZOLAM HCL 2 MG/2ML IJ SOLN
INTRAMUSCULAR | Status: AC
  Filled 2015-12-30: qty 2

## 2015-12-30 MED ORDER — SUGAMMADEX SODIUM 500 MG/5ML IV SOLN
INTRAVENOUS | Status: DC | PRN
  Administered 2015-12-30: 400 mg via INTRAVENOUS

## 2015-12-30 MED ORDER — LACTATED RINGERS IV SOLN
INTRAVENOUS | Status: DC | PRN
  Administered 2015-12-30 (×3): via INTRAVENOUS

## 2015-12-30 MED ORDER — SODIUM CHLORIDE 0.9 % IJ SOLN
INTRAMUSCULAR | Status: DC | PRN
  Administered 2015-12-30: 20 mL

## 2015-12-30 MED ORDER — TAMOXIFEN CITRATE 10 MG PO TABS
20.0000 mg | ORAL_TABLET | Freq: Every day | ORAL | Status: DC
  Administered 2015-12-30 – 2016-01-01 (×3): 20 mg via ORAL
  Filled 2015-12-30 (×4): qty 2

## 2015-12-30 SURGICAL SUPPLY — 66 items
APL ESCP 34 STRL LF DISP (HEMOSTASIS) ×1
APPLICATOR SURGIFLO ENDO (HEMOSTASIS) ×3 IMPLANT
BAG SPEC RTRVL LRG 6X4 10 (ENDOMECHANICALS) ×1
CHLORAPREP W/TINT 26ML (MISCELLANEOUS) ×3 IMPLANT
CLIP LIGATING HEM O LOK PURPLE (MISCELLANEOUS) ×3 IMPLANT
CLIP LIGATING HEMO LOK XL GOLD (MISCELLANEOUS) IMPLANT
CLIP LIGATING HEMO O LOK GREEN (MISCELLANEOUS) ×4 IMPLANT
CLIP SUT LAPRA TY ABSORB (SUTURE) ×3 IMPLANT
COVER TIP SHEARS 8 DVNC (MISCELLANEOUS) ×1 IMPLANT
COVER TIP SHEARS 8MM DA VINCI (MISCELLANEOUS) ×2
DECANTER SPIKE VIAL GLASS SM (MISCELLANEOUS) ×1 IMPLANT
DRAIN CHANNEL 15F RND FF 3/16 (WOUND CARE) ×1 IMPLANT
DRAPE COLUMN DVNC XI (DISPOSABLE) ×1 IMPLANT
DRAPE DA VINCI XI COLUMN (DISPOSABLE) ×2
DRAPE INCISE IOBAN 66X45 STRL (DRAPES) ×3 IMPLANT
DRAPE LAPAROSCOPIC ABDOMINAL (DRAPES) ×1 IMPLANT
DRAPE SHEET LG 3/4 BI-LAMINATE (DRAPES) ×3 IMPLANT
ELECT PENCIL ROCKER SW 15FT (MISCELLANEOUS) ×3 IMPLANT
ELECT REM PT RETURN 9FT ADLT (ELECTROSURGICAL) ×3
ELECTRODE REM PT RTRN 9FT ADLT (ELECTROSURGICAL) ×1 IMPLANT
EVACUATOR SILICONE 100CC (DRAIN) ×1 IMPLANT
GLOVE BIO SURGEON STRL SZ 6.5 (GLOVE) ×2 IMPLANT
GLOVE BIO SURGEONS STRL SZ 6.5 (GLOVE) ×1
GLOVE BIOGEL M STRL SZ7.5 (GLOVE) ×18 IMPLANT
GOWN STRL REUS W/TWL LRG LVL3 (GOWN DISPOSABLE) ×12 IMPLANT
HEMOSTAT SURGICEL 4X8 (HEMOSTASIS) ×5 IMPLANT
KIT BASIN OR (CUSTOM PROCEDURE TRAY) ×3 IMPLANT
LIQUID BAND (GAUZE/BANDAGES/DRESSINGS) ×3 IMPLANT
LOOP VESSEL MAXI BLUE (MISCELLANEOUS) ×3 IMPLANT
MARKER SKIN DUAL TIP RULER LAB (MISCELLANEOUS) ×3 IMPLANT
NDL INSUFFLATION 14GA 120MM (NEEDLE) ×1 IMPLANT
NEEDLE INSUFFLATION 14GA 120MM (NEEDLE) ×3 IMPLANT
NS IRRIG 1000ML POUR BTL (IV SOLUTION) ×1 IMPLANT
PORT ACCESS TROCAR AIRSEAL 12 (TROCAR) ×1 IMPLANT
PORT ACCESS TROCAR AIRSEAL 5M (TROCAR) ×2
POSITIONER SURGICAL ARM (MISCELLANEOUS) ×2 IMPLANT
POUCH SPECIMEN RETRIEVAL 10MM (ENDOMECHANICALS) ×3 IMPLANT
RELOAD STAPLE 60 2.6 WHT THN (STAPLE) IMPLANT
RELOAD STAPLER WHITE 60MM (STAPLE) IMPLANT
SET TRI-LUMEN FLTR TB AIRSEAL (TUBING) ×3 IMPLANT
SET TUBE IRRIG SUCTION NO TIP (IRRIGATION / IRRIGATOR) ×2 IMPLANT
SOLUTION ELECTROLUBE (MISCELLANEOUS) ×3 IMPLANT
SPONGE LAP 4X18 X RAY DECT (DISPOSABLE) ×3 IMPLANT
STAPLE ECHEON FLEX 60 POW ENDO (STAPLE) IMPLANT
STAPLER RELOAD WHITE 60MM (STAPLE)
SURGIFLO W/THROMBIN 8M KIT (HEMOSTASIS) ×3 IMPLANT
SUT ETHILON 3 0 PS 1 (SUTURE) ×3 IMPLANT
SUT MNCRL AB 4-0 PS2 18 (SUTURE) ×6 IMPLANT
SUT PDS AB 1 CT1 27 (SUTURE) ×6 IMPLANT
SUT V-LOC BARB 180 2/0GR6 GS22 (SUTURE) ×6
SUT VIC AB 0 CT1 27 (SUTURE) ×15
SUT VIC AB 0 CT1 27XBRD ANTBC (SUTURE) ×4 IMPLANT
SUT VIC AB 2-0 SH 27 (SUTURE) ×9
SUT VIC AB 2-0 SH 27X BRD (SUTURE) ×2 IMPLANT
SUT VLOC BARB 180 ABS3/0GR12 (SUTURE)
SUTURE V-LC BRB 180 2/0GR6GS22 (SUTURE) IMPLANT
SUTURE VLOC BRB 180 ABS3/0GR12 (SUTURE) ×1 IMPLANT
TAPE STRIPS DRAPE STRL (GAUZE/BANDAGES/DRESSINGS) ×1 IMPLANT
TOWEL OR 17X26 10 PK STRL BLUE (TOWEL DISPOSABLE) ×4 IMPLANT
TOWEL OR NON WOVEN STRL DISP B (DISPOSABLE) ×3 IMPLANT
TRAY FOLEY W/METER SILVER 14FR (SET/KITS/TRAYS/PACK) ×3 IMPLANT
TRAY FOLEY W/METER SILVER 16FR (SET/KITS/TRAYS/PACK) ×1 IMPLANT
TRAY LAPAROSCOPIC (CUSTOM PROCEDURE TRAY) ×3 IMPLANT
TROCAR BLADELESS OPT 5 100 (ENDOMECHANICALS) IMPLANT
TROCAR XCEL 12X100 BLDLESS (ENDOMECHANICALS) ×3 IMPLANT
WATER STERILE IRR 1500ML POUR (IV SOLUTION) ×6 IMPLANT

## 2015-12-30 NOTE — Brief Op Note (Signed)
12/30/2015  4:12 PM  PATIENT:  Kayla Johnston  50 y.o. female  PRE-OPERATIVE DIAGNOSIS:  RIGHT RENAL MASS  POST-OPERATIVE DIAGNOSIS:  right renal mass  PROCEDURE:  Procedure(s): XI ROBOTIC ASSITED PARTIAL NEPHRECTOMY WITH ULTRASOUND (Right)  SURGEON:  Surgeon(s) and Role:    * Alexis Frock, MD - Primary  PHYSICIAN ASSISTANT:   ASSISTANTS: Clemetine Marker PA   ANESTHESIA:   general  Warm Ischemia Time: 33 minutes  EBL:  Total I/O In: 1000 [I.V.:1000] Out: 800 [Urine:475; Blood:325]  BLOOD ADMINISTERED:none  DRAINS: 1 - JP to bulb, 2 - Foley to gravity   LOCAL MEDICATIONS USED:  MARCAINE     SPECIMEN:  Source of Specimen:  Rt partial nephrectomy  DISPOSITION OF SPECIMEN:  PATHOLOGY  COUNTS:  YES  TOURNIQUET:  * No tourniquets in log *  DICTATION: .Other Dictation: Dictation Number [id number]  PLAN OF CARE: Admit to inpatient   PATIENT DISPOSITION:  PACU - hemodynamically stable.   Delay start of Pharmacological VTE agent (>24hrs) due to surgical blood loss or risk of bleeding: yes

## 2015-12-30 NOTE — Anesthesia Preprocedure Evaluation (Addendum)
Anesthesia Evaluation  Patient identified by MRN, date of birth, ID band Patient awake    Reviewed: Allergy & Precautions, NPO status , Patient's Chart, lab work & pertinent test results  Airway Mallampati: II  TM Distance: >3 FB Neck ROM: Full    Dental  (+) Dental Advisory Given, Teeth Intact   Pulmonary neg pulmonary ROS,    Pulmonary exam normal breath sounds clear to auscultation       Cardiovascular Exercise Tolerance: Good negative cardio ROS Normal cardiovascular exam Rhythm:Regular Rate:Normal     Neuro/Psych negative neurological ROS  negative psych ROS   GI/Hepatic negative GI ROS, Neg liver ROS,   Endo/Other  Obesity   Renal/GU Right renal mass      Musculoskeletal negative musculoskeletal ROS (+)   Abdominal   Peds  Hematology negative hematology ROS (+)   Anesthesia Other Findings Day of surgery medications reviewed with the patient.  Right breast cancer s/p Right simple mastectomy with sentinel lymph node mapping and prophylactic left simple mastectomy  Reproductive/Obstetrics negative OB ROS                           Anesthesia Physical Anesthesia Plan  ASA: II  Anesthesia Plan: General   Post-op Pain Management:    Induction: Intravenous  Airway Management Planned: Oral ETT  Additional Equipment:   Intra-op Plan:   Post-operative Plan: Extubation in OR  Informed Consent: I have reviewed the patients History and Physical, chart, labs and discussed the procedure including the risks, benefits and alternatives for the proposed anesthesia with the patient or authorized representative who has indicated his/her understanding and acceptance.   Dental advisory given  Plan Discussed with: CRNA  Anesthesia Plan Comments: (Risks/benefits of general anesthesia discussed with patient including risk of damage to teeth, lips, gum, and tongue, nausea/vomiting, allergic  reactions to medications, and the possibility of heart attack, stroke and death.  All patient questions answered.  Patient wishes to proceed.  2nd PIV after induction)       Anesthesia Quick Evaluation

## 2015-12-30 NOTE — Discharge Instructions (Signed)

## 2015-12-30 NOTE — H&P (Signed)
Kayla Johnston is an 50 y.o. female.    Chief Complaint: Pre-o Right partial v. Radical nephrectomy  HPI:   1 - Right Renal Neoplasm - 3.7cm approx 50% exophytic Rt lower lateral enhancing solid mass incidental by ER CT on eval abd-chest pain. Mass 3.7cm with a tongue of tissue that appears to extend very near renal sinus. 1 early branching artery / 1 vein right renovascular anatomy. Cr 0.7, LFT's normal. No contralateral lesions. Chest CT w/o chest lesions.    PMH sig for breast CA (bilat mastectomy by [facility] 2017, likely adjuvant tamoxifen pending, follows G. Magrinat), open appy. No CV disease / blood thinners.    Today "Kayla Johnston" is seen to proceed with RIGHT partial v. Radical nephrectomy. No interval fevers.   Past Medical History  Diagnosis Date  . Breast cancer ([place]) right  . Breast cancer of upper-outer quadrant of right female breast ([place]) 07/05/2015  . Right renal mass   . Splinter     RIGHT 3RD FINGER X 1 WEEK, PT TO REMOVE ASAP.    Past Surgical History  Procedure Laterality Date  . Appendectomy    . Breast biopsy      biopsy  . Simple mastectomy w/ sentinel node biopsy Right 08/23/2015  . Simple mastectomy Left 08/23/2015  . Simple mastectomy with axillary sentinel node biopsy Right 08/23/2015    Procedure: RIGHT SIMPLE MASTECTOMY WITH RIGHT SENTINEL LYMPH  NODE MAPPING;  Surgeon: Erroll Luna, MD;  Location: [facility];  Service: General;  Laterality: Right;  . Simple mastectomy with axillary sentinel node biopsy Left 08/23/2015    Procedure: Left SIMPLE MASTECTOMY,prophylactic;  Surgeon: Erroll Luna, MD;  Location: [facility];  Service: General;  Laterality: Left;    Family History  Problem Relation Age of Onset  . Cancer Sister     cervical   Social History:  reports that she has never smoked. She has never used smokeless tobacco. She reports that she does not drink alcohol or use illicit drugs.  Allergies: No Known Allergies  No prescriptions prior to  admission    No results found for this or any previous visit (from the past 48 hour(s)). No results found.  Review of Systems  Constitutional: Negative.  Negative for fever and chills.  HENT: Negative.   Eyes: Negative.   Respiratory: Negative.   Cardiovascular: Negative.   Gastrointestinal: Negative.   Genitourinary: Negative.   Musculoskeletal: Negative.   Skin: Negative.   Neurological: Negative.   Endo/Heme/Allergies: Negative.   Psychiatric/Behavioral: Negative.     There were no vitals taken for this visit. Physical Exam  Constitutional: She appears well-developed.  HENT:  Head: Normocephalic.  Eyes: Pupils are equal, round, and reactive to light.  Neck: Normal range of motion.  Cardiovascular: Normal rate.   Respiratory: Effort normal.  GI: Soft.  Genitourinary:  No CVAT  Musculoskeletal: Normal range of motion.  Neurological: She is alert.  Skin: Skin is warm.  Psychiatric: She has a normal mood and affect. Her behavior is normal. Judgment and thought content normal.     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    We rediscussed the role of partial nephrectomy with the overall goals being a balance of trying to achieve complete surgical excision (negative margins) while minimizing loss of normally functioning kidney. We then rediscussed surgical approaches including robotic and open techniques with robotic associated with a shorter convalescence. I showed the patient on their abdomen the approximately 4-6 incision (  trocar) sites as well as presumed extraction sites with robotic approach as well as possible open incision sites. We specifically readdressed that there may be need to alter operative plans according to intraopertive findings including conversion to open procedure or conversion to radical nephrectomy as well as need for adjunctive procedures such as ureteral stenting to promote correct renal healing.  We rediscussed specific peri-operative risks including bleeding, infection, deep vein thrombosis, pulmonary embolism, compartment syndrome, neuropathy / neuropraxia, heart attack, stroke, death, as well as long-term risks such as non-cure / need for additional therapy and need for imaging and lab based post-op surveillance protocols. We rediscussed typical hospital course of approximately 2 day hospitalization, need for peri-operative drains / catheters, and typical post-hospital course with return to most non-strenuous activities by 2 weeks and ability to return to most jobs and more strenuous activity such as exercise by 6 weeks.     After this lengthy and detail discussion, including answering all of the patient's questions to their satisfaction, they have chosen to proceed today as planned.  Alexis Frock, MD 12/30/2015, 6:41 AM

## 2015-12-30 NOTE — Transfer of Care (Signed)
Immediate Anesthesia Transfer of Care Note  Patient: Kayla Johnston  Procedure(s) Performed: Procedure(s): XI ROBOTIC ASSITED PARTIAL NEPHRECTOMY WITH ULTRASOUND (Right)  Patient Location: PACU  Anesthesia Type:General  Level of Consciousness: awake, oriented, patient cooperative, lethargic and responds to stimulation  Airway & Oxygen Therapy: Patient Spontanous Breathing and Patient connected to face mask oxygen  Post-op Assessment: Report given to RN, Post -op Vital signs reviewed and stable and Patient moving all extremities  Post vital signs: Reviewed and stable  Last Vitals:  Filed Vitals:   12/30/15 0955  BP: 157/75  Pulse: 67  Temp: 36.6 C  Resp: 16    Last Pain:  Filed Vitals:   12/30/15 1221  PainSc: 5       Patients Stated Pain Goal: 5 ([id number])  Complications: No apparent anesthesia complications

## 2015-12-30 NOTE — Anesthesia Procedure Notes (Signed)
Procedure Name: Intubation Date/Time: 12/30/2015 12:42 PM Performed by: Ofilia Neas Pre-anesthesia Checklist: Patient identified, Emergency Drugs available, Suction available, Patient being monitored and Timeout performed Patient Re-evaluated:Patient Re-evaluated prior to inductionOxygen Delivery Method: Circle system utilized Preoxygenation: Pre-oxygenation with 100% oxygen Intubation Type: Cricoid Pressure applied and IV induction Ventilation: Mask ventilation without difficulty Laryngoscope Size: Mac and 3 Grade View: Grade II Tube type: Oral Tube size: 7.5 mm Number of attempts: 1 Airway Equipment and Method: Stylet Placement Confirmation: ETT inserted through vocal cords under direct vision,  positive ETCO2 and breath sounds checked- equal and bilateral Secured at: 21 cm Tube secured with: Tape Dental Injury: Teeth and Oropharynx as per pre-operative assessment  Difficulty Due To: Difficulty was anticipated, Difficult Airway- due to large tongue, Difficult Airway- due to anterior larynx and Difficult Airway- due to limited oral opening Future Recommendations: Recommend- induction with short-acting agent, and alternative techniques readily available Comments: Anterior.  Cords visualized with cricoid pressure/head lift

## 2015-12-31 LAB — BASIC METABOLIC PANEL
Anion gap: 6 (ref 5–15)
BUN: 11 mg/dL (ref 6–20)
CHLORIDE: 105 mmol/L (ref 101–111)
CO2: 26 mmol/L (ref 22–32)
CREATININE: 0.9 mg/dL (ref 0.44–1.00)
Calcium: 8.2 mg/dL — ABNORMAL LOW (ref 8.9–10.3)
GFR calc non Af Amer: >60 mL/min (ref >60)
Glucose, Bld: 164 mg/dL — ABNORMAL HIGH (ref 65–99)
POTASSIUM: 4.1 mmol/L (ref 3.5–5.1)
Sodium: 137 mmol/L (ref 135–145)

## 2015-12-31 LAB — HEMOGLOBIN AND HEMATOCRIT, BLOOD
HEMATOCRIT: 31.2 % — AB (ref 36.0–46.0)
Hemoglobin: 10.5 g/dL — ABNORMAL LOW (ref 12.0–15.0)

## 2015-12-31 LAB — CREATININE, FLUID (PLEURAL, PERITONEAL, JP DRAINAGE): CREAT FL: 7.2 mg/dL

## 2015-12-31 MED ORDER — ALUM & MAG HYDROXIDE-SIMETH 200-200-20 MG/5ML PO SUSP
30.0000 mL | Freq: Four times a day (QID) | ORAL | Status: DC | PRN
  Administered 2015-12-31 – 2016-01-02 (×5): 30 mL via ORAL
  Filled 2015-12-31 (×5): qty 30

## 2015-12-31 MED ORDER — ALUM & MAG HYDROXIDE-SIMETH 200-200-20 MG/5ML PO SUSP
30.0000 mL | Freq: Once | ORAL | Status: AC
  Administered 2015-12-31: 30 mL via ORAL
  Filled 2015-12-31: qty 30

## 2015-12-31 NOTE — Progress Notes (Signed)
1 Day Post-Op Subjective: Patient reports nausea, incisional pain, tolerating PO and pain control adequate. JP creatinine was 7.2. Hemoglobin 10.5  Objective: Vital signs in last 24 hours: Temp:  [98.4 F (36.9 C)-99.3 F (37.4 C)] 98.9 F (37.2 C) (06/10 2017) Pulse Rate:  [54-103] 103 (06/10 2017) Resp:  [16-28] 28 (06/10 2017) BP: (100-129)/(54-67) 129/61 mmHg (06/10 2017) SpO2:  [87 %-100 %] 96 % (06/10 2025)  Intake/Output from previous day: 06/09 0701 - 06/10 0700 In: 4131.7 [P.O.:600; I.V.:3531.7] Out: 2205 [Urine:1750; Drains:130; Blood:325] Intake/Output this shift: Total I/O In: -  Out: 1360 [Urine:1350; Drains:10]  Physical Exam:  General:alert, cooperative and appears stated age GI: soft and tenderness: RUQ and RLQ Female genitalia: not done Resp: clear to auscultation bilaterally Extremities: extremities normal, atraumatic, no cyanosis or edema  Lab Results:  Recent Labs  12/30/15 1658 12/31/15 0520  HGB 11.9* 10.5*  HCT 35.4* 31.2*   BMET  Recent Labs  12/31/15 0520  NA 137  K 4.1  CL 105  CO2 26  GLUCOSE 164*  BUN 11  CREATININE 0.90  CALCIUM 8.2*   No results for input(s): LABPT, INR in the last 72 hours. No results for input(s): LABURIN in the last 72 hours. Results for orders placed or performed during the hospital encounter of 07/05/15  Rapid strep screen     Status: None   Collection Time: 07/05/15  4:20 AM  Result Value Ref Range Status   Streptococcus, Group A Screen (Direct) NEGATIVE NEGATIVE Final    Comment: (NOTE) A Rapid Antigen test may result negative if the antigen level in the sample is below the detection level of this test. The FDA has not cleared this test as a stand-alone test therefore the rapid antigen negative result has reflexed to a Group A Strep culture.   Culture, Group A Strep     Status: None   Collection Time: 07/05/15  4:20 AM  Result Value Ref Range Status   Strep A Culture Negative  Final   Comment: (NOTE) Performed At: [facility] [place], [place] [id number] Lindon Romp MD [id number]     Studies/Results: No results found.  Assessment/Plan: POD #1 from right robotic partial nephrectomy  Plan: 1. Continue foley and stent due to elevated drain creatinine 2. Advance diet as tolerated 3. Ambulate in halls 4. Continue current pain control regiment   LOS: 1 day   Nicolette Bang 12/31/2015, 8:47 PM

## 2015-12-31 NOTE — Progress Notes (Signed)
Pt with 2 person assist able to sit and then stand by the bed side. Pt now burping and states that is helping the pain. Pt tolerated the activity fair and put back to bed. Maintain Current plan of care.

## 2015-12-31 NOTE — Op Note (Signed)
NAMEJAZZMAN, Kayla Johnston NO.:  [id number]  MEDICAL RECORD NO.:  [id number]  LOCATION:  [id number]                         FACILITY:  [facility]  PHYSICIAN:  Kayla Frock, MD     DATE OF BIRTH:  September 02, 1965  DATE OF PROCEDURE:                              OPERATIVE REPORT   DIAGNOSIS:  Right renal mass.  PROCEDURE:  Right robotic-assisted partial nephrectomy. Intraoperative ultrasound with interpretation.  ASSISTANT:  Debbrah Alar, PA  ESTIMATED BLOOD LOSS:  300 mL.  WARM ISCHEMIA TIME: 34 minutes  COMPLICATIONS:  None.  SPECIMENS:  Right partial nephrectomy for pathology.  FINDINGS: 1. Early branching artery, single vein right renovascular anatomy. 2. Approximately 40% exophytic, 60% endophytic, right lower anterior     renal mass.  The deep aspect encroaching towards renal sinus fat by     intraoperative ultrasound.  DRAINS: 1. Jackson-Pratt drain bulb suction. 2. Foley catheter to straight drain.  INDICATION:  Kayla Johnston is a very pleasant, but somewhat unfortunate 50 year old lady with history of breast cancer status post bilateral mastectomy.  She was found on metastatic survey to also have a right lower pole enhancing solid renal mass.  She has no metastatic disease.  This is quite concerning for localized renal cell carcinoma. She had no contralateral lesions.  Renal function was acceptable. Options were discussed for management including surveillance protocols versus ablative therapy versus surgical extirpation with and without minimally invasive assistance and with and without nephron sparing, and the patient wished to undergo right partial nephrectomy.  Informed consent was obtained and placed in medical record.  PROCEDURE IN DETAIL:  The patient being Kayla Johnston, was verified.  Procedure being right robotic partial nephrectomy was confirmed.  Procedure was carried out.  Time-out was performed. Intravenous antibiotics were  administered.  General endotracheal anesthesia was induced.  The patient was placed into a right side up full flank position, applying 15 degrees of stable flexion, superior arm elevator, axillary roll, sequential pressure devices, bottom leg bent, top leg straight.  She was further fashioned to the operating table using 3-inch tape over foam padding across her supraxiphoid abdomen and her pelvis over foam padding.  Superior arm elevator was applied.  Foley catheter had been placed per urethra to straight drain prior to lateral positioning.  Sterile field was created by prepping and draping the patient's entire right flank and abdomen using chlorhexidine gluconate and a high-flow low-pressure pneumoperitoneum was obtained using Veress technique in the right lower quadrant having passed the aspiration and drop test.  Next, an 8-mm robotic camera port was placed and positioned approximately 1 handbreadth superolateral to the umbilicus. Laparoscopic examination of the peritoneal cavity revealed no significant adhesions, no visceral injury.  The liver was quite enlarged, but without nodular changes.  A 5 mm subxiphoid port was placed through which a self-locking grasper was used to elevate the liver superiorly to allow access to the retroperitoneal structures. Additional ports were then placed as follows:  Right subcostal 8-mm robotic port, right far lateral 8-mm robotic port, 4 fingerbreadths superior medial to the anterior iliac spine, right paramedian inferior 8- mm robotic port approximately 1-1/2 handbreadth superior to the pubic ramus, and two 12 mm assistant port sites in the midline,  1 just superior to the umbilicus and another 2 fingerbreadths above the camera port.  Robot was docked and passed through electronic checks.  Initial attention was directed at development of retroperitoneum.  Incision was made lateral to the ascending colon from the area of the cecum toward the area of  the hepatic flexure and was carefully swept medially.  The duodenum was encountered and very carefully kocherized medially such that lateral edge lie medial to the midpoint of the vena cava.  Lower pole of kidney was identified and placed on gentle lateral traction. Dissection proceeded medial to this.  The gonadal vessels were visualized and seen inserting into the vena cava.  The ureter was also encountered.  The ureter was placed on gentle lateral traction. Dissection proceeded within this triangle towards the area of the renal hilum.  The renal hilum consisted of a single very early branching artery and single vein right renovascular anatomy as anticipated.  The artery and vein were separately marked with vessel loops such that there was good visualization of the common arterial trunk prior to the branching for later warm ischemia.  Next, attention was directed to identification of the lower pole mass.  The mass was clearly seen on its anterolateral surface and it was carefully defatted circumferentially around the mass keeping a large bucket handle of fat on the superior aspect of the mass.  There was minimal sticky fat around the mass, and the kidney was defatted up to the area of the hilum circumferentially. This allowed very good exposure of the area of the ureteral and UPJ insertion.  Intraoperative ultrasound was then performed using drop-in ultrasound probe.  Intraoperative ultrasound revealed a solid-appearing density lower pole mass that again was approximately 40% exophytic, 60% endophytic with a deep component that seemed to come in apposition to hilar fat.  Using external clues and the intraoperative ultrasound, a plane was scored around the lower pole of the kidney and a lower heminephrectomy technique, amputating approximately the lower 1/3rd to 1/4th of the kidney as this was felt to be the most adequate plane for partial nephrectomy to achieve negative margin giving  the deep aspect of the tumor.  This line was also chosen taking exquisite care to visualization of the area of the UPJ to avoid injury to this area.    Warm ischemia was then achieved using 2 bulldog clamps on the arterial stump, and partial nephrectomy was then carefully performed using cold scissors at the previously marked demarcation taking great care to avoid positive margin and keeping what appeared to be a rim of normal parenchymal tissue with the size of the specimen.  There was a purposeful intrinsic course into the lower pole collecting system keeping in plane with lower pole heminephrectomy.  Several small venous sinuses were seen and coagulated with monopolar cautery, and the partial nephrectomy specimen was set aside for permanent pathology.  First layer of renorrhaphy was performed using a running 3-0 V-Loc suture, over-sewing several open venous sinuses and figure-of-eight sewing over several small arterial vessels. The second layer of renorrhaphy was performed also using 3-0 running V- Loc suture over-sewing the prior entrance collecting system bringing into tension-free apposition.  The bulldog clamps were then released to stop warm ischemia time.  Hemostasis appeared quite excellent.  The area of partial nephrectomy revealed no obvious concern for gross tumor. Therefore, frozen section was not sent.  A bolster was applied to the area of the base of the partial nephrectomy, and parenchymal apposition sutures were  applied with 0 Vicryl, sandwiched between Hem-o-Lok and lapper ties x5, which resulted in excellent parenchymal apposition of the lower pole site.  Again, exquisite care was taken to avoid any angulation transection or clip placement into the area of renal pelvis or UPJ.  The vessel loops were removed.  All sponge and needle counts were correct.  Hemostasis appeared excellent.  Pneumoperitoneum was reduced and again hemostasis appeared excellent.  10 mL of  FloSeal was applied to the area of renorrhaphy and renal hilum.  The patient had received mannitol pre and post clamping.  The retroperitoneum was reapproximated using running 3-0 V-Loc bringing the lateral pericolonic tissue back in apposition to the posterior abdominal wall.  Liver retractor was taken down.  Liver was inspected and found to be uninjured.  A closed suction drain was brought through the previous lateral most robotic port site near peritoneal cavity.  The prior superior 12 mm assistant port site was closed at the level of fascia using Carter-Thomason suture passer under laparoscopic vision.  Specimen was retrieved by extending the lower assistant port site for total distance approximately 3 cm removing the partial nephrectomy specimen and setting it aside for pathology.  This site was closed at the level of fascia using figure-of-eight PDS x3 followed by reapproximation of Scarpa's with running Vicryl.  All incision sites were infiltrated with dilute lyophilized Marcaine and closed at the level of the skin using subcuticular Monocryl followed by Dermabond, and procedure was terminated.  The patient tolerated the procedure well, and there were no immediate periprocedural complications.  The patient was taken to postanesthesia care unit in stable condition.          ______________________________ Kayla Frock, MD     TM/MEDQ  D:  12/30/2015  T:  12/31/2015  Job:  [id number]

## 2015-12-31 NOTE — Progress Notes (Signed)
MD updated via phone regarding Pt's c/o increased pain centered mainly to the right side and radiating to the main abdomen. VSS checked low grade temp 99.3 otherwise vs stable. Discussed with MD and bedrest order changed.

## 2016-01-01 LAB — CREATININE, FLUID (PLEURAL, PERITONEAL, JP DRAINAGE): Creat, Fluid: 0.9 mg/dL

## 2016-01-01 MED ORDER — BISACODYL 10 MG RE SUPP
10.0000 mg | Freq: Once | RECTAL | Status: AC
  Administered 2016-01-01: 10 mg via RECTAL
  Filled 2016-01-01: qty 1

## 2016-01-01 NOTE — Anesthesia Postprocedure Evaluation (Signed)
Anesthesia Post Note  Patient: Kayla Johnston  Procedure(s) Performed: Procedure(s) (LRB): XI ROBOTIC ASSITED PARTIAL NEPHRECTOMY WITH ULTRASOUND (Right)  Patient location during evaluation: PACU Anesthesia Type: General Level of consciousness: awake Pain management: pain level controlled Vital Signs Assessment: post-procedure vital signs reviewed and stable Respiratory status: spontaneous breathing Cardiovascular status: stable Postop Assessment: no signs of nausea or vomiting Anesthetic complications: no    Last Vitals:  Filed Vitals:   12/31/15 2348 01/01/16 0556  BP: 119/54 124/59  Pulse: 79 91  Temp: 37.1 C 37.4 C  Resp: 20 16    Last Pain:  Filed Vitals:   01/01/16 0726  PainSc: Asleep                 Tahiri Shareef

## 2016-01-01 NOTE — Progress Notes (Signed)
2 Days Post-Op Subjective: Patient reports nausea, incisional pain, tolerating PO and pain control adequate. JP creatinine was 0.9.   Objective: Vital signs in last 24 hours: Temp:  [98.7 F (37.1 C)-99.4 F (37.4 C)] 99.1 F (37.3 C) (06/11 1500) Pulse Rate:  [79-103] 95 (06/11 1500) Resp:  [16-28] 18 (06/11 1500) BP: (119-134)/(54-75) 134/75 mmHg (06/11 1500) SpO2:  [87 %-96 %] 94 % (06/11 1500)  Intake/Output from previous day: 06/10 0701 - 06/11 0700 In: 2880 [P.O.:480; I.V.:2400] Out: 2860 [Urine:2775; Drains:85] Intake/Output this shift: Total I/O In: -  Out: 20 [Drains:20]  Physical Exam:  General:alert, cooperative and appears stated age GI: soft and tenderness: RUQ and RLQ Female genitalia: not done Resp: clear to auscultation bilaterally Extremities: extremities normal, atraumatic, no cyanosis or edema  Lab Results:  Recent Labs  12/30/15 1658 12/31/15 0520  HGB 11.9* 10.5*  HCT 35.4* 31.2*   BMET  Recent Labs  12/31/15 0520  NA 137  K 4.1  CL 105  CO2 26  GLUCOSE 164*  BUN 11  CREATININE 0.90  CALCIUM 8.2*   No results for input(s): LABPT, INR in the last 72 hours. No results for input(s): LABURIN in the last 72 hours. Results for orders placed or performed during the hospital encounter of 07/05/15  Rapid strep screen     Status: None   Collection Time: 07/05/15  4:20 AM  Result Value Ref Range Status   Streptococcus, Group A Screen (Direct) NEGATIVE NEGATIVE Final    Comment: (NOTE) A Rapid Antigen test may result negative if the antigen level in the sample is below the detection level of this test. The FDA has not cleared this test as a stand-alone test therefore the rapid antigen negative result has reflexed to a Group A Strep culture.   Culture, Group A Strep     Status: None   Collection Time: 07/05/15  4:20 AM  Result Value Ref Range Status   Strep A Culture Negative  Final    Comment: (NOTE) Performed At: [facility] [place], [place] [id number] Lindon Romp MD [id number]     Studies/Results: No results found.  Assessment/Plan: POD #2 from right robotic partial nephrectomy  Plan: 1. D/c foley 2. Dulcolax daily 3. Discharge tomorrow   LOS: 2 days   Nicolette Bang 01/01/2016, 4:29 PM

## 2016-01-02 ENCOUNTER — Encounter (HOSPITAL_COMMUNITY): Payer: Self-pay | Admitting: Urology

## 2016-01-02 ENCOUNTER — Encounter: Payer: Self-pay | Admitting: Nurse Practitioner

## 2016-01-02 DIAGNOSIS — C50411 Malignant neoplasm of upper-outer quadrant of right female breast: Secondary | ICD-10-CM

## 2016-01-02 MED ORDER — SENNOSIDES-DOCUSATE SODIUM 8.6-50 MG PO TABS
1.0000 | ORAL_TABLET | Freq: Two times a day (BID) | ORAL | Status: DC
  Administered 2016-01-02: 1 via ORAL
  Filled 2016-01-02: qty 1

## 2016-01-02 MED ORDER — SENNOSIDES-DOCUSATE SODIUM 8.6-50 MG PO TABS: 1.0000 | ORAL_TABLET | Freq: Two times a day (BID) | ORAL | Status: DC

## 2016-01-02 NOTE — Progress Notes (Signed)
The Survivorship Care Plan (in [place]) was mailed to Kayla Johnston after she was unable to keep her appointment in to the Survivorship Clinic for an in-person visit at this time. A letter (in [place]) was mailed to her outlining the purpose of the content of the care plan, as well as encouraging her to reach out to me with any questions or concerns.  My business card was included in the correspondence to the patient as well.    I will not be placing any follow-up appointments to the Survivorship Clinic for Kayla Johnston, but I am happy to see her at any time in the future for any survivorship concerns that may arise. Thank you for allowing me to participate in her care!  Kenn File, [place] [phone]

## 2016-01-02 NOTE — Discharge Summary (Signed)
Physician Discharge Summary  Patient ID: Kayla Johnston MRN: [id number] DOB/AGE: June 18, 1966 50 y.o.  Admit date: 12/30/2015 Discharge date: 01/02/2016  Admission Diagnoses: RIGHT Renal mass  Discharge Diagnoses:  Active Problems:   Renal mass   Discharged Condition: good  Hospital Course:   1-  RIGHT Renal Mass - pt underwent right robotic partial nephrectomy / lower pole hemi-nephrectomy for enhancing renal mass on 12/30/15, the day of admission, withtou acute complication. Hgb stable at 10.5 and JP Cr 0.9 (same as serum) and <185mL output in 24 hours after foley discontinued, piror to discharge. By AM of POD 3, the day of discharge, she is ambulatory, tollerating PO diet, pain managable on PO meds, and felt to be adequate for discharge.   Consults: None  Significant Diagnostic Studies: labs: as per HPI. Final path pending at discharge.  Treatments: surgery: right robotic partial nephrectomy / lower pole hemi-nephrectomy for enhancing renal mass on 12/30/15  Discharge Exam: Blood pressure 121/58, pulse 70, temperature 98.4 F (36.9 C), temperature source Oral, resp. rate 18, height 5' (1.524 m), weight 73.936 kg (163 lb), last menstrual period 12/10/2015, SpO2 95 %. General appearance: alert, cooperative, appears stated age and boyfriend at bedside Eyes: negative Nose: Nares normal. Septum midline. Mucosa normal. No drainage or sinus tenderness. Throat: lips, mucosa, and tongue normal; teeth and gums normal Neck: supple, symmetrical, trachea midline Back: symmetric, no curvature. ROM normal. No CVA tenderness. Chest wall: no tenderness Breasts: s/p bilateral mastectomy Cardio: Nl rate GI: soft, non-tender; bowel sounds normal; no masses,  no organomegaly Extremities: extremities normal, atraumatic, no cyanosis or edema Pulses: 2+ and symmetric Skin: Skin color, texture, turgor normal. No rashes or lesions Incision/Wound: Recent port sites / extraction sites c/d/i. JP with  scant serous output, removed and dry dressing placed.   Disposition: 01-Home or Self Care     Medication List    STOP taking these medications        LORazepam 0.5 MG tablet  Commonly known as:  ATIVAN     naproxen sodium 220 MG tablet  Commonly known as:  ANAPROX      TAKE these medications        acetaminophen 500 MG tablet  Commonly known as:  TYLENOL  Take 500 mg by mouth every 6 (six) hours as needed for moderate pain.     HYDROcodone-acetaminophen 5-325 MG tablet  Commonly known as:  NORCO  Take 1-2 tablets by mouth every 6 (six) hours as needed for moderate pain.     tamoxifen 20 MG tablet  Commonly known as:  NOLVADEX  Take 20 mg by mouth daily.           Follow-up Information    Follow up with Alexis Frock, MD On 01/16/2016.   Specialty:  Urology   Why:  11 AM for MD visit. Dr. Tresa Moore will call you with pathology results when available.    Contact information:   [address] [place] [address] [phone]       Signed: Alexis Frock 01/02/2016, 7:15 AM

## 2016-01-02 NOTE — Progress Notes (Signed)
Completed D/C teaching with patient and family. Answered questions. Gave prescriptions. Pt will be D/C home with family in stable condition.

## 2016-01-02 NOTE — Care Management Note (Signed)
Case Management Note  Patient Details  Name: Kayla Johnston MRN: [id number] Date of Birth: 11-03-1965  Subjective/Objective:                    Action/Plan:d/c home no needs or orders.   Expected Discharge Date:                  Expected Discharge Plan:  Home/Self Care  In-House Referral:     Discharge planning Services  CM Consult  Post Acute Care Choice:    Choice offered to:     DME Arranged:    DME Agency:     HH Arranged:    [place] Agency:     Status of Service:  Completed, signed off  Medicare Important Message Given:    Date Medicare IM Given:    Medicare IM give by:    Date Additional Medicare IM Given:    Additional Medicare Important Message give by:     If discussed at [place] of Stay Meetings, dates discussed:    Additional Comments:  Dessa Phi, RN 01/02/2016, 11:12 AM

## 2016-01-05 ENCOUNTER — Other Ambulatory Visit: Payer: Self-pay | Admitting: Oncology

## 2016-01-29 NOTE — Progress Notes (Signed)
No show

## 2016-01-30 ENCOUNTER — Encounter: Payer: Self-pay | Admitting: Oncology

## 2016-01-30 ENCOUNTER — Other Ambulatory Visit: Payer: Self-pay

## 2016-02-07 ENCOUNTER — Telehealth (HOSPITAL_COMMUNITY): Payer: Self-pay | Admitting: *Deleted

## 2016-02-07 NOTE — Telephone Encounter (Signed)
Telephoned patient at home # and left message to return call to [facility]. Used interpreter Marly Adams.  

## 2016-04-04 ENCOUNTER — Telehealth (HOSPITAL_COMMUNITY): Payer: Self-pay | Admitting: *Deleted

## 2016-04-04 NOTE — Telephone Encounter (Signed)
Telephoned patient at home number and voicemail box not set up.

## 2016-04-19 ENCOUNTER — Ambulatory Visit: Payer: Self-pay | Admitting: Physical Therapy

## 2016-04-24 ENCOUNTER — Ambulatory Visit: Payer: Self-pay | Attending: Surgery | Admitting: Physical Therapy

## 2016-04-24 DIAGNOSIS — R222 Localized swelling, mass and lump, trunk: Secondary | ICD-10-CM

## 2016-04-24 DIAGNOSIS — R29898 Other symptoms and signs involving the musculoskeletal system: Secondary | ICD-10-CM

## 2016-04-24 DIAGNOSIS — R293 Abnormal posture: Secondary | ICD-10-CM

## 2016-04-24 DIAGNOSIS — M546 Pain in thoracic spine: Secondary | ICD-10-CM

## 2016-04-24 NOTE — Therapy (Signed)
[place], [place], [address] Phone: [phone]   Fax:  [phone]  Physical Therapy Evaluation  Patient Details  Name: Kayla Johnston MRN: [id number] Date of Birth: 07-10-1966 Referring Provider: Erroll Luna, MD  Encounter Date: 04/24/2016      PT End of Session - 04/24/16 1708    Visit Number 1   Number of Visits 9   Date for PT Re-Evaluation 05/25/16   PT Start Time 1616   PT Stop Time 1659   PT Time Calculation (min) 43 min   Activity Tolerance Patient tolerated treatment well   Behavior During Therapy [facility] for tasks assessed/performed      Past Medical History:  Diagnosis Date  . Breast cancer ([place]) right  . Breast cancer of upper-outer quadrant of right female breast ([place]) 07/05/2015  . Right renal mass   . Splinter    RIGHT 3RD FINGER X 1 WEEK, PT TO REMOVE ASAP.    Past Surgical History:  Procedure Laterality Date  . APPENDECTOMY    . BREAST BIOPSY     biopsy  . ROBOTIC ASSITED PARTIAL NEPHRECTOMY Right 12/30/2015   Procedure: XI ROBOTIC ASSITED PARTIAL NEPHRECTOMY WITH ULTRASOUND;  Surgeon: Alexis Frock, MD;  [facility]: [facility];  Service: Urology;  Laterality: Right;  . SIMPLE MASTECTOMY Left 08/23/2015  . SIMPLE MASTECTOMY W/ SENTINEL NODE BIOPSY Right 08/23/2015  . SIMPLE MASTECTOMY WITH AXILLARY SENTINEL NODE BIOPSY Right 08/23/2015   Procedure: RIGHT SIMPLE MASTECTOMY WITH RIGHT SENTINEL LYMPH  NODE MAPPING;  Surgeon: Erroll Luna, MD;  Location: [place];  Service: General;  Laterality: Right;  . SIMPLE MASTECTOMY WITH AXILLARY SENTINEL NODE BIOPSY Left 08/23/2015   Procedure: Left SIMPLE MASTECTOMY,prophylactic;  Surgeon: Erroll Luna, MD;  Location: [place];  Service: General;  Laterality: Left;    There were no vitals filed for this visit.       Subjective Assessment - 04/24/16 1624    Subjective I have a lot of pain across chest and back, the doctor said the  muscles are not strong enough and are tight. I couldn't even touch my chest in the shower because of the pain. After doing everything at home, I have burning and pain in the chest because of the pressure from the bra.   Patient is accompained by: Family member;Interpreter  her niece and niece's son   Pertinent History Patient diagnosed with right breast cancer; had bilateral mastectomy with right sentinel lymph node biopsy on 08/23/15, no chemotherapy or radiation, currently taking Tamoxifen. She was told she was cancer free. She had surgery on to remove mass on her right kidney in June 2017; removed part of her kidney.   Patient Stated Goals to get rid of the pain, to take a shower/change clothes without pain   Currently in Pain? Yes   Pain Score 10-Worst pain ever   Pain Location Back   Pain Descriptors / Indicators Burning   Pain Type Surgical pain   Pain Onset Other (comment)  for 2 months, after kidney surgery   Pain Frequency Constant   Aggravating Factors  unknown   Pain Relieving Factors twisting her body, pain medication   Multiple Pain Sites Yes   Pain Score 8   Pain Location Chest   Pain Descriptors / Indicators Burning   Pain Type Surgical pain   Pain Onset Other (comment)  2 months; after kidney surgery   Pain Frequency Constant   Aggravating Factors  nothing   Pain Relieving Factors  pain medication, twisting body            [facility] PT Assessment - 04/24/16 0001      Assessment   Medical Diagnosis right breast cancer   Referring Provider Erroll Luna, MD   Onset Date/Surgical Date 08/23/15   Hand Dominance Right   Next MD Visit 3 months   Prior Therapy none     Restrictions   Weight Bearing Restrictions No     Balance Screen   Has the patient fallen in the past 6 months No   Has the patient had a decrease in activity level because of a fear of falling?  Yes  doesn't want to fall on her chest   Is the patient reluctant to leave their home because of a fear  of falling?  No     Home Environment   Living Environment Private residence   Living Arrangements Spouse/significant other  her boyfriend   Type of [place] to enter   Entrance Stairs-Number of Steps 3   Entrance Stairs-Rails Can reach both     Prior Function   Level of Independence Independent   Vocation Unemployed   Leisure energy is too low to exercise; just does exercise for about 5 minutes (tries to go for 30 minute walk when she feels fine)     Cognition   Overall Cognitive Status Within Functional Limits for tasks assessed     Observation/Other Assessments   Observations swelling noted at bilateral axillae; tender to palpation to soft tissue of upper back and at bilateral mastectomy scars R>L; scapular dyskinesia on the left with bilateral scaption     Posture/Postural Control   Posture/Postural Control Postural limitations   Postural Limitations Rounded Shoulders;Forward head     Tone   Assessment Location --     ROM / Strength   AROM / PROM / Strength AROM     AROM   Overall AROM Comments cervical AROM WFL for all directions   AROM Assessment Site Shoulder   Right Shoulder Flexion 156 Degrees  pain in back   Right Shoulder ABduction 168 Degrees   Left Shoulder Flexion 140 Degrees  pain in back   Left Shoulder ABduction 157 Degrees     Ambulation/Gait   Ambulation/Gait Yes   Ambulation/Gait Assistance 7: Independent              Katina Dung - 04/24/16 0001    Open a tight or new jar No difficulty   Do heavy household chores (wash walls, wash floors) No difficulty   Carry a shopping bag or briefcase No difficulty   Wash your back Mild difficulty   Use a knife to cut food No difficulty   Recreational activities in which you take some force or impact through your arm, shoulder, or hand (golf, hammering, tennis) No difficulty   During the past week, to what extent has your arm, shoulder or hand problem interfered with your  normal social activities with family, friends, neighbors, or groups? Not at all   During the past week, to what extent has your arm, shoulder or hand problem limited your work or other regular daily activities Not at all   Arm, shoulder, or hand pain. None   Tingling (pins and needles) in your arm, shoulder, or hand None   Difficulty Sleeping No difficulty   DASH Score 2.27 %  PT Education - 04/24/16 1709    Education provided Yes   Education Details provided patient with information about [employer]   Person(s) Educated Patient;Other (comment)  Niece   Methods Explanation;Handout   Comprehension Verbalized understanding                [facility] Term Clinic Goals - 04/24/16 1720      CC Long Term Goal  #1   Title Patient will report a 50% decrease in the pain in her upper back and chest.   Time 4   Period Weeks   Status New     CC Long Term Goal  #2   Title Patient will be independent in HEP for strengthening of postural and scapular muscles.   Time 4   Period Weeks   Status New     CC Long Term Goal  #3   Title Patient will demonstrate full bilateral shoulder AROM without any reports of pain for improved performance of ADLs.   Time 4   Period Weeks   Status New            Plan - 04/24/16 1709    Clinical Impression Statement Patient is a 50 year old Spanish-speaking female with history of bilateral mastectomy on 08/23/15 and right kidney surgery in June of 2017. She reports to physical therapy with complaints of signficant pain in across her chest and upper back, as well as decreased energy level. She demonstrates good cervical and bilateral shoulder AROM, but reports pain with bilateral shoulder flexion. She sits with a forward head and rounded shoulders and is painful to palpation at her upper back and bilateral mastectomy scars. She also has some swelling at both axillae.   Rehab Potential Good   Clinical Impairments  Affecting Rehab Potential bilateral mastectomy and right kidney surgeries   PT Frequency 2x / week   PT Duration 4 weeks   PT Treatment/Interventions ADLs/Self Care Home Management;Electrical Stimulation;Therapeutic exercise;DME Instruction;Patient/family education;Manual techniques;Manual lymph drainage;Scar mobilization;Passive range of motion;Taping   PT Next Visit Plan begin soft tissue work to upper back, postural exercises, thoracic spine mobility, desensitization to bilateral mastectomy scars, possibly manual lymph drainage   Consulted and Agree with Plan of Care Patient      Patient will benefit from skilled therapeutic intervention in order to improve the following deficits and impairments:  Decreased activity tolerance, Decreased scar mobility, Increased edema, Postural dysfunction, Pain  Visit Diagnosis: Pain in thoracic spine  Other symptoms and signs involving the musculoskeletal system  Abnormal posture  Localized swelling, mass and lump, trunk     Problem List Patient Active Problem List   Diagnosis Date Noted  . Renal mass 12/30/2015  . Right renal mass 11/23/2015  . Breast cancer of upper-outer quadrant of right female breast ([place]) 07/05/2015    Mellody Life 04/24/2016, 5:28 PM  [place], [place], [address] Phone: [phone]   Fax:  [phone]  Name: Kayla Johnston MRN: [id number] Date of Birth: [birth year]  Saverio Danker, SPT

## 2016-04-25 ENCOUNTER — Ambulatory Visit: Payer: Self-pay

## 2016-04-27 ENCOUNTER — Ambulatory Visit: Payer: Self-pay | Admitting: Physical Therapy

## 2016-04-30 ENCOUNTER — Encounter: Payer: Self-pay | Admitting: Physical Therapy

## 2016-04-30 ENCOUNTER — Ambulatory Visit: Payer: Self-pay | Admitting: Physical Therapy

## 2016-04-30 NOTE — Therapy (Signed)
[place], [place], [address] Phone: [phone]   Fax:  [phone]  Patient Details  Name: Kayla Johnston MRN: [id number] Date of Birth: September 25, 1965 Referring Provider:  No ref. provider found  Encounter Date: 04/30/2016  The patient did not show up for her scheduled appointment today. The interpreter called the patient while at the facility regarding her appointment. The patient's niece answered the phone and stated that the patient did not come to the appointment because she did not feel comfortable with an interpreter being present. It was explained to the patient's niece that outpatient policy is an interpreter must be present at all visits even if the patient has family to interpret for the safety of the facility and the patient. Patient's niece asked that all future appointments be cancelled. Gave the patient the option of using a phone interpreter so that an interpreter doesn't has to be physically present at the appointment. Pt's niece states she called the patient and that she wants to discontinue all therapy appointments because she is overwhelmed about the interpreter situation. All appointments for this patient will be cancelled but will hold off on discharging patient from therapy for two weeks.   Alexia Freestone 04/30/2016, 4:06 PM  [place], [place], [address] Phone: [phone]   Fax:  [phone]

## 2016-05-02 ENCOUNTER — Encounter: Payer: Self-pay | Admitting: Physical Therapy

## 2016-05-03 NOTE — Addendum Note (Signed)
Addended by: Kipp Laurence on: 05/03/2016 05:00 PM   Modules accepted: Orders

## 2016-05-03 NOTE — Therapy (Signed)
[place], [place], [address] Phone: [phone]   Fax:  [phone]  Physical Therapy Treatment  Patient Details  Name: Kayla Johnston MRN: [id number] Date of Birth: 07-29-1965 Referring Provider: Erroll Luna, MD  Encounter Date: 04/24/2016    Past Medical History:  Diagnosis Date  . Breast cancer ([place]) right  . Breast cancer of upper-outer quadrant of right female breast ([place]) 07/05/2015  . Right renal mass   . Splinter    RIGHT 3RD FINGER X 1 WEEK, PT TO REMOVE ASAP.    Past Surgical History:  Procedure Laterality Date  . APPENDECTOMY    . BREAST BIOPSY     biopsy  . ROBOTIC ASSITED PARTIAL NEPHRECTOMY Right 12/30/2015   Procedure: XI ROBOTIC ASSITED PARTIAL NEPHRECTOMY WITH ULTRASOUND;  Surgeon: Alexis Frock, MD;  [facility];  Service: Urology;  Laterality: Right;  . SIMPLE MASTECTOMY Left 08/23/2015  . SIMPLE MASTECTOMY W/ SENTINEL NODE BIOPSY Right 08/23/2015  . SIMPLE MASTECTOMY WITH AXILLARY SENTINEL NODE BIOPSY Right 08/23/2015   Procedure: RIGHT SIMPLE MASTECTOMY WITH RIGHT SENTINEL LYMPH  NODE MAPPING;  Surgeon: Erroll Luna, MD;  Location: [place];  Service: General;  Laterality: Right;  . SIMPLE MASTECTOMY WITH AXILLARY SENTINEL NODE BIOPSY Left 08/23/2015   Procedure: Left SIMPLE MASTECTOMY,prophylactic;  Surgeon: Erroll Luna, MD;  Location: [place];  Service: General;  Laterality: Left;    There were no vitals filed for this visit.                                       [facility] Goals - 04/24/16 1720      CC Long Term Goal  #1   Title Patient will report a 50% decrease in the pain in her upper back and chest.   Time 4   Period Weeks   Status New     CC Long Term Goal  #2   Title Patient will be independent in HEP for strengthening of postural and scapular muscles.   Time 4   Period Weeks   Status New     CC Long  Term Goal  #3   Title Patient will demonstrate full bilateral shoulder AROM without any reports of pain for improved performance of ADLs.   Time 4   Period Weeks   Status New          Patient will benefit from skilled therapeutic intervention in order to improve the following deficits and impairments:  Decreased activity tolerance, Decreased scar mobility, Increased edema, Postural dysfunction, Pain  Visit Diagnosis: Pain in thoracic spine - Plan: PT plan of care cert/re-cert  Other symptoms and signs involving the musculoskeletal system - Plan: PT plan of care cert/re-cert  Abnormal posture - Plan: PT plan of care cert/re-cert  Localized swelling, mass and lump, trunk - Plan: PT plan of care cert/re-cert     Problem List Patient Active Problem List   Diagnosis Date Noted  . Renal mass 12/30/2015  . Right renal mass 11/23/2015  . Breast cancer of upper-outer quadrant of right female breast ([place]) 07/05/2015   PHYSICAL THERAPY DISCHARGE SUMMARY  Visits from Start of Care: 1  Current functional level related to goals / functional outcomes: As above  Pt declined to return to PT   Remaining deficits: As above    Education / Equipment: As above  Plan: Patient agrees to discharge.  Patient goals were not met. Patient is being discharged due to the patient's request.  ?????     Norwood Levo 05/03/2016, 4:58 PM  [place], [place], [address] Phone: [phone]   Fax:  [phone]  Name: Kayla Johnston MRN: [id number] Date of Birth: 1965-10-30

## 2016-05-04 ENCOUNTER — Encounter: Payer: Self-pay | Admitting: Physical Therapy

## 2016-05-09 ENCOUNTER — Encounter: Payer: Self-pay | Admitting: Physical Therapy

## 2016-08-15 ENCOUNTER — Encounter (HOSPITAL_COMMUNITY): Payer: Self-pay | Admitting: *Deleted

## 2017-01-15 ENCOUNTER — Other Ambulatory Visit: Payer: Self-pay | Admitting: *Deleted

## 2017-01-15 NOTE — Progress Notes (Signed)
CLINIC:  Survivorship   REASON FOR VISIT:  Routine follow-up for history of breast cancer.   BRIEF ONCOLOGIC HISTORY:    Breast cancer of upper-outer quadrant of right female breast ([place])   06/29/2015 Mammogram    Right breast: oval mass with calcs UOQ, middle depth.  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 0/3 LN positive      08/23/2015 Pathologic Stage    Stage IA: T1b N0      11/23/2015 -  Anti-estrogen oral therapy    Tamoxifen 20 mg daily       Survivorship    [facility] (in [place]) mailed to patient after her missing her appointment        INTERVAL HISTORY:  Kayla Johnston present today with her husband for concerns of pain in her right breast.  She is spanish speaking only and is accompanied by an interpreter Doretha Imus.  The pain in her right breast has been there for about 8 days-2 weeks.  No associated skin change.  She has no breast nodules.  The pain radiates down to her fingers and she did develop swelling in the hand.  The pain radiated to all of her fingers.  She is not taking Tamoxifen.  She stopped taking it about 8 months ago.  She stopped because her friend who took it, her cancer came back, so she didn't think it was right to continue Tamoxifen.  She says her shoulders and back hurt for a while and she is unable to specify time.  She does work and is very active.  She has a lot of repetitious movement with cleaning a lot.  She is having increased vaginal bleeding, and she was having cycles every other month, but this month, she is has had vaginal bleeding daily.     REVIEW OF SYSTEMS:  Review of Systems  Constitutional: Negative  for appetite change, chills, diaphoresis, fatigue and fever.  HENT:   Negative for hearing loss and lump/mass.   Eyes: Negative for eye problems and icterus.  Respiratory: Negative for chest tightness, cough and shortness of breath.   Cardiovascular: Negative for chest pain, leg swelling and palpitations.  Gastrointestinal: Negative for abdominal distention, abdominal pain, constipation, diarrhea, nausea and vomiting.  Endocrine: Negative for hot flashes.  Genitourinary: Negative for difficulty urinating.   Musculoskeletal: Positive for arthralgias, back pain and neck pain.  Skin: Negative for itching and rash.  Neurological: Negative for dizziness, extremity weakness and headaches.  Hematological: Negative for adenopathy. Does not bruise/bleed easily.        PAST MEDICAL/SURGICAL HISTORY:  Past Medical History:  Diagnosis Date  . Breast cancer ([place]) right  . Breast cancer of upper-outer quadrant of right female breast ([place]) 07/05/2015  . Right renal mass   . Splinter    RIGHT 3RD FINGER X 1 WEEK, PT TO REMOVE ASAP.   Past Surgical History:  Procedure Laterality Date  . APPENDECTOMY    . BREAST BIOPSY     biopsy  . ROBOTIC ASSITED PARTIAL NEPHRECTOMY Right 12/30/2015   Procedure: XI ROBOTIC ASSITED PARTIAL NEPHRECTOMY WITH ULTRASOUND;  Surgeon: Alexis Frock, MD;  [facility];  Service: Urology;  Laterality: Right;  . SIMPLE MASTECTOMY Left 08/23/2015  . SIMPLE MASTECTOMY W/ SENTINEL NODE BIOPSY Right 08/23/2015  . SIMPLE MASTECTOMY WITH AXILLARY SENTINEL NODE BIOPSY Right 08/23/2015   Procedure: RIGHT SIMPLE MASTECTOMY WITH RIGHT SENTINEL LYMPH  NODE MAPPING;  Surgeon: Erroll Luna, MD;  Location: [place];  Service: General;  Laterality: Right;  . SIMPLE MASTECTOMY WITH AXILLARY SENTINEL NODE BIOPSY Left 08/23/2015   Procedure: Left SIMPLE MASTECTOMY,prophylactic;  Surgeon: Erroll Luna, MD;  Location: [place];  Service: General;  Laterality: Left;     ALLERGIES:  No  Known Allergies   CURRENT MEDICATIONS:  Outpatient Encounter Prescriptions as of 01/16/2017  Medication Sig  . acetaminophen (TYLENOL) 500 MG tablet Take 500 mg by mouth every 6 (six) hours as needed for moderate pain.  . [DISCONTINUED] HYDROcodone-acetaminophen (NORCO) 5-325 MG tablet Take 1-2 tablets by mouth every 6 (six) hours as needed for moderate pain. (Patient not taking: Reported on 04/24/2016)  . [DISCONTINUED] senna-docusate (SENOKOT-S) 8.6-50 MG tablet Take 1 tablet by mouth 2 (two) times daily. While taking pain meds to prevent constipation. (Patient not taking: Reported on 04/24/2016)  . [DISCONTINUED] tamoxifen (NOLVADEX) 20 MG tablet Take 20 mg by mouth daily.   No facility-administered encounter medications on file as of 01/16/2017.      ONCOLOGIC FAMILY HISTORY:  Family History  Problem Relation Age of Onset  . Cancer Sister        cervical    SOCIAL HISTORY:  Kayla Johnston, Kayla Johnston. 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  She denies any current or history of tobacco, alcohol, or illicit drug use.     PHYSICAL EXAMINATION:  Vital Signs: Vitals:   01/16/17 0936  BP: (!) 150/76  Pulse: 60  Resp: 17  Temp: 97.8 F (36.6 C)   Filed Weights   01/16/17 0936  Weight: 166 lb 1.6 oz (75.3 kg)   General: Well-nourished, well-appearing female in no acute distress.  Accompanied by her husband today.   HEENT: Head is normocephalic.  Pupils equal and reactive to light. Conjunctivae clear without exudate.  Sclerae anicteric. Oral mucosa is pink, moist.  Oropharynx is pink without lesions or erythema.  Lymph: No cervical, supraclavicular, or infraclavicular lymphadenopathy noted on palpation.  Cardiovascular: Regular rate and rhythm.Marland Kitchen Respiratory: Clear to auscultation bilaterally. Chest expansion symmetric; breathing non-labored.  Breast Exam:  -Breasts: bilateral breasts are surgically absent, there  are no nodules, masses present.  The right chest wall is very tender to palpation, however that has been present since surgery -Axilla: No axillary adenopathy bilaterally.  GI: Abdomen soft and round; non-tender, non-distended. Bowel sounds normoactive. No hepatosplenomegaly.   GU: Deferred.  Neuro: No focal deficits. Steady gait.  Psych: Mood and affect normal and appropriate for situation.  MSK: No focal spinal tenderness to palpation, full range of motion in bilateral upper extremities Extremities: +right arm is swollen Skin: Warm and dry.  LABORATORY DATA:  None for this visit  DIAGNOSTIC IMAGING:  Most recent mammogram: n/a s/p bilateral mastectomy    ASSESSMENT AND PLAN:  Kayla Johnston is a pleasant 51 y.o. female with history of Stage IA right breast invasive ductal carcinoma, ER+/PR+/HER2-, diagnosed in 06/2015, treated with bilateral mastectomies and tamoxifen which she only took for about 3-4 months.  She presents to the Survivorship Clinic for surveillance and routine follow-up.   1. History of breast cancer:  Kayla Johnston is currently clinically and radiographically  without evidence of disease or recurrence of breast cancer. Please see #2.  She has stopped taking tamoxifen, however is also having what sounds like dysfunctional uterine bleeding.  I have reached out to our social workers in addition to [employer] for assistance in getting her into somewhere for some evaluation.  We will contact the patient through Almyra Free, our interpreter, once we have something set up.    2. Pain and right arm swelling: Patient will undergo cervical and thoracic x rays.  Pain is likely related to lymphedema.  Inez Catalina, one of the lymphedema specialists who I work with was able to come into the appointment and see the patient and explain why an appointment with her was very necessary for treatment of her arm and regaining the functionality of it.  She will see the patient on Monday,  01/21/2017.       Dispo:  -Return to cancer center in one month for re evaluation.     A total of (30) minutes of face-to-face time was spent with this patient with greater than 50% of that time in counseling and care-coordination.   Gardenia Phlegm, NP Survivorship Program [place] [phone]   Note: PRIMARY CARE PROVIDER Patient, No Pcp Per None None

## 2017-01-16 ENCOUNTER — Ambulatory Visit (HOSPITAL_COMMUNITY)
Admission: RE | Admit: 2017-01-16 | Discharge: 2017-01-16 | Disposition: A | Discharge status: Home / Self Care | Payer: Self-pay | Source: Ambulatory Visit | Attending: Adult Health | Admitting: Adult Health

## 2017-01-16 ENCOUNTER — Ambulatory Visit (HOSPITAL_BASED_OUTPATIENT_CLINIC_OR_DEPARTMENT_OTHER): Payer: Self-pay | Admitting: Adult Health

## 2017-01-16 ENCOUNTER — Encounter: Payer: Self-pay | Admitting: Adult Health

## 2017-01-16 VITALS — BP 150/76 | HR 60 | Temp 97.8°F | Resp 17 | Ht 60.0 in | Wt 166.1 lb

## 2017-01-16 DIAGNOSIS — C50411 Malignant neoplasm of upper-outer quadrant of right female breast: Secondary | ICD-10-CM

## 2017-01-16 DIAGNOSIS — M5134 Other intervertebral disc degeneration, thoracic region: Secondary | ICD-10-CM | POA: Insufficient documentation

## 2017-01-16 DIAGNOSIS — Z170 Estrogen receptor positive status [ER+]: Principal | ICD-10-CM

## 2017-01-16 DIAGNOSIS — M419 Scoliosis, unspecified: Secondary | ICD-10-CM | POA: Insufficient documentation

## 2017-01-16 DIAGNOSIS — M5030 Other cervical disc degeneration, unspecified cervical region: Secondary | ICD-10-CM | POA: Insufficient documentation

## 2017-01-16 DIAGNOSIS — M79601 Pain in right arm: Secondary | ICD-10-CM

## 2017-01-16 DIAGNOSIS — N938 Other specified abnormal uterine and vaginal bleeding: Secondary | ICD-10-CM

## 2017-01-16 DIAGNOSIS — M7989 Other specified soft tissue disorders: Secondary | ICD-10-CM

## 2017-01-16 DIAGNOSIS — Z853 Personal history of malignant neoplasm of breast: Secondary | ICD-10-CM

## 2017-01-21 ENCOUNTER — Ambulatory Visit: Payer: Self-pay | Attending: Oncology | Admitting: Physical Therapy

## 2017-01-21 ENCOUNTER — Encounter: Payer: Self-pay | Admitting: Physical Therapy

## 2017-01-21 DIAGNOSIS — R293 Abnormal posture: Secondary | ICD-10-CM | POA: Insufficient documentation

## 2017-01-21 DIAGNOSIS — M25611 Stiffness of right shoulder, not elsewhere classified: Secondary | ICD-10-CM | POA: Insufficient documentation

## 2017-01-21 DIAGNOSIS — R29898 Other symptoms and signs involving the musculoskeletal system: Secondary | ICD-10-CM | POA: Insufficient documentation

## 2017-01-21 DIAGNOSIS — M25511 Pain in right shoulder: Secondary | ICD-10-CM | POA: Insufficient documentation

## 2017-01-21 DIAGNOSIS — G8929 Other chronic pain: Secondary | ICD-10-CM | POA: Insufficient documentation

## 2017-01-21 NOTE — Therapy (Signed)
[place], [place], [address] Phone: [phone]   Fax:  [phone]  Physical Therapy Evaluation  Patient Details  Name: Kayla Johnston MRN: [id number] Date of Birth: 1966/02/21 Referring Provider: Wilber Bihari, NP  Encounter Date: 01/21/2017      PT End of Session - 01/21/17 1034    Visit Number 1   Number of Visits 4   Date for PT Re-Evaluation 02/18/17   PT Start Time 0928   PT Stop Time 1057   PT Time Calculation (min) 89 min   Activity Tolerance Patient tolerated treatment well   Behavior During Therapy [facility] for tasks assessed/performed      Past Medical History:  Diagnosis Date  . Breast cancer ([place]) right  . Breast cancer of upper-outer quadrant of right female breast ([place]) 07/05/2015  . Right renal mass   . Splinter    RIGHT 3RD FINGER X 1 WEEK, PT TO REMOVE ASAP.    Past Surgical History:  Procedure Laterality Date  . APPENDECTOMY    . BREAST BIOPSY     biopsy  . ROBOTIC ASSITED PARTIAL NEPHRECTOMY Right 12/30/2015   Procedure: XI ROBOTIC ASSITED PARTIAL NEPHRECTOMY WITH ULTRASOUND;  Surgeon: Alexis Frock, MD;  [facility];  Service: Urology;  Laterality: Right;  . SIMPLE MASTECTOMY Left 08/23/2015  . SIMPLE MASTECTOMY W/ SENTINEL NODE BIOPSY Right 08/23/2015  . SIMPLE MASTECTOMY WITH AXILLARY SENTINEL NODE BIOPSY Right 08/23/2015   Procedure: RIGHT SIMPLE MASTECTOMY WITH RIGHT SENTINEL LYMPH  NODE MAPPING;  Surgeon: Erroll Luna, MD;  Location: [facility];  Service: General;  Laterality: Right;  . SIMPLE MASTECTOMY WITH AXILLARY SENTINEL NODE BIOPSY Left 08/23/2015   Procedure: Left SIMPLE MASTECTOMY,prophylactic;  Surgeon: Erroll Luna, MD;  Location: [facility];  Service: General;  Laterality: Left;    There were no vitals filed for this visit.       Subjective Assessment - 01/21/17 0935    Subjective Patient underwent a bilateral mastectomy 08/23/15 with right  sentinel node biopsy (3 nodes removed which were negative). She did not require surgery or radiation. She was on Tamoxifen for 3 months but stopped due to a friend getting breast cancer while taking it. She is now having vaginal bleeding irregularly and is having abdominal pain. She is suppose to be scheduled to see a gynecologist but has not heard from her doctor about a referral to that. Her complaint and reason for being here at PT is that she has tightness and pain across her chest. She also has some right back tightness from kidney surgery last year.   Patient is accompained by: Family member   Pertinent History Bilateral mastectomy 08/23/15 with right sentinel node biopsy. No chemo or radiation needed. Right kidney benign mass removed 6/17.   Patient Stated Goals Decrease pain   Currently in Pain? Yes   Pain Score 7    Pain Location Chest   Pain Orientation Right;Left   Pain Descriptors / Indicators Tightness;Sharp  Hypersensitivity on skin around incisions   Pain Type Neuropathic pain   Pain Onset More than a month ago   Pain Frequency Intermittent   Aggravating Factors  Activity   Pain Relieving Factors Rest            [facility] PT Assessment - 01/21/17 0001      Assessment   Medical Diagnosis s/p bilateral mastectomy   Referring Provider Wilber Bihari, NP   Onset Date/Surgical Date 08/23/15   Hand Dominance Right  Prior Therapy yes     Precautions   Precautions Other (comment)   Precaution Comments right arm lymphedema risk     Restrictions   Weight Bearing Restrictions No     Balance Screen   Has the patient fallen in the past 6 months No   Has the patient had a decrease in activity level because of a fear of falling?  No   Is the patient reluctant to leave their home because of a fear of falling?  No     Home Ecologist residence   Living Arrangements Spouse/significant other;Other relatives  Husband and brother-in-law   Available  Help at Discharge Family     Prior Function   Level of Independence Independent   Vocation Full time employment   Psychologist, counselling   Leisure She does not exercise     Cognition   Overall Cognitive Status Within Functional Limits for tasks assessed     Posture/Postural Control   Posture/Postural Control Postural limitations   Postural Limitations Rounded Shoulders;Forward head     ROM / Strength   AROM / PROM / Strength AROM     AROM   AROM Assessment Site Shoulder   Right/Left Shoulder Right;Left   Right Shoulder Extension 28 Degrees   Right Shoulder Flexion 145 Degrees   Right Shoulder ABduction 142 Degrees   Right Shoulder Internal Rotation 73 Degrees   Right Shoulder External Rotation 67 Degrees   Left Shoulder Extension 39 Degrees   Left Shoulder Flexion 145 Degrees   Left Shoulder ABduction 151 Degrees   Left Shoulder Internal Rotation 68 Degrees   Left Shoulder External Rotation 82 Degrees     Palpation   Palpation comment Incisions appear to be very well healed but she has hypersensitivity on her skin on the right chest around her incision site.           LYMPHEDEMA/ONCOLOGY QUESTIONNAIRE - 01/21/17 1003      Right Upper Extremity Lymphedema   15 cm Proximal to Olecranon Process 35 cm   10 cm Proximal to Olecranon Process 31.8 cm   Olecranon Process 26.1 cm   15 cm Proximal to Ulnar Styloid Process 26.2 cm   10 cm Proximal to Ulnar Styloid Process 24.4 cm   Just Proximal to Ulnar Styloid Process 16.5 cm   Across Hand at [employer] 18.4 cm   At [place] of 2nd Digit 6.2 cm     Left Upper Extremity Lymphedema   15 cm Proximal to Olecranon Process 34.3 cm   10 cm Proximal to Olecranon Process 31.2 cm   Olecranon Process 25 cm   15 cm Proximal to Ulnar Styloid Process 26 cm   10 cm Proximal to Ulnar Styloid Process 24.2 cm   Just Proximal to Ulnar Styloid Process 16 cm   Across Hand at [employer] 18.2 cm   At [place] of 2nd Digit 6.2  cm           Quick Dash - 01/21/17 0001    Open a tight or new jar Moderate difficulty   Do heavy household chores (wash walls, wash floors) Moderate difficulty   Carry a shopping bag or briefcase Severe difficulty   Wash your back Moderate difficulty   Use a knife to cut food Moderate difficulty   Recreational activities in which you take some force or impact through your arm, shoulder, or hand (golf, hammering, tennis) Mild difficulty   During the past week,  to what extent has your arm, shoulder or hand problem interfered with your normal social activities with family, friends, neighbors, or groups? Quite a bit   During the past week, to what extent has your arm, shoulder or hand problem limited your work or other regular daily activities Quite a bit   Arm, shoulder, or hand pain. Moderate   Tingling (pins and needles) in your arm, shoulder, or hand Moderate   DASH Score 47.73 %      Objective measurements completed on examination: See above findings.          [facility] Adult PT Treatment/Exercise - 01/21/17 0001      Exercises   Exercises Shoulder     Shoulder Exercises: Pulleys   Flexion 2 minutes   ABduction 2 minutes   ABduction Limitations PT tactile cues for proper technique     Shoulder Exercises: Therapy Ball   Flexion 10 reps   Flexion Limitations After PT demo   ABduction 10 reps   ABduction Limitations After PT demo with pain at end ROM                PT Education - 01/21/17 1132    Education provided Yes                [place] - 01/21/17 1141      CC Long Term Goal  #1   Title Patient will report a 50% decrease in the pain in her upper back and chest.   Time 4   Period Weeks   Status New     CC Long Term Goal  #2   Title Patient will be independent in HEP for stretching postural and scapular muscles.   Time 4   Period Weeks   Status New     CC Long Term Goal  #3   Title Patient will verbalize understanding of  lympehdema risk reduction practices.   Time 4   Period Weeks   Status New     CC Long Term Goal  #4   Title Improve Quick DASH score to </= 30 for improved overall arm function.   Time 4   Period Weeks   Status New             Plan - 01/21/17 1125    Clinical Impression Statement Patient is a very pleasant spanish speaking woman who is 18 months post op bilateral mastectomy with a right sentinel node biopsy for estrogen receptive breast cancer. She did not require chemo or radiation. She was taking Tamoxifen after surgery but then stopped due to a friend having a breast cancer recurrence while on Tamoxifen which scared her. She is trying to get an appointment scheduled to see a gynecologist for some issues she is having and as long as that is cleared, she reprots she has been advised by her doctor to resume tamoxifen. She has right back pain and chest tightness and pain from her surgery and that is her primary reason for being here. She has financial concerns but has agreed to come to therapy 2-3 times. She will benefit from PT to reduce chest tightness and pain and to desensitize her chest area which is hypersensitive since the surgery.   History and Personal Factors relevant to plan of care: Spanish speaking   Clinical Presentation Stable   Clinical Presentation due to: Condition is stable   Clinical Decision Making Low   Rehab Potential Good   Clinical Impairments Affecting Rehab Potential  Limited PT visits   PT Frequency 1x / week   PT Duration 4 weeks   PT Treatment/Interventions ADLs/Self Care Home Management;Therapeutic exercise;Therapeutic activities;DME Instruction;Patient/family education;Manual techniques;Scar mobilization;Passive range of motion   PT Next Visit Plan ROM exercises; desensitization to right chest as tolerated; chest opening stretches / postural exercises   PT Home Exercise Plan Latissimus stretches; shoulder ROM exercises; assisted pt with ordering knitted  knockers today; issued instructions for getting a sleeve and glove at [facility] with [facility] paying for those so check on that with pt.   Consulted and Agree with Plan of Care Patient;Family member/caregiver   Family Building surveyor and husband      Patient will benefit from skilled therapeutic intervention in order to improve the following deficits and impairments:  Impaired sensation, Pain, Impaired UE functional use, Decreased scar mobility, Decreased knowledge of precautions, Decreased range of motion, Postural dysfunction  Visit Diagnosis: Abnormal posture - Plan: PT plan of care cert/re-cert  Chronic right shoulder pain - Plan: PT plan of care cert/re-cert  Stiffness of right shoulder, not elsewhere classified - Plan: PT plan of care cert/re-cert     Problem List Patient Active Problem List   Diagnosis Date Noted  . Renal mass 12/30/2015  . Right renal mass 11/23/2015  . Breast cancer of upper-outer quadrant of right female breast ([place]) 07/05/2015    Annia Friendly, PT 01/21/17 11:46 AM  [place], [place], [address] Phone: [phone]   Fax:  [phone]  Name: Taleeya Blondin MRN: [id number] Date of Birth: 08-Mar-1966

## 2017-01-21 NOTE — Patient Instructions (Signed)
External Rotation    De pie con manos entrelazadas detrs de la cabeza. Lleve los codos hacia atrs tanto como le sea posible. Sostenga __5__ segundos. Repita __5__ veces. Realice __8__ sesiones por da.  Copyright  VHI. All rights reserved.  Cane Exercise: Abduction    Sostenga el bastn por la punta con la mano derecha con la palma [place] arriba. La otra mano con la palma hacia abajo. Levante el brazo derecho hacia el costado y arriba empujando el bastn con el otro brazo. Mantenga __3__ segundos. Repita __10__ veces. Haga __2__ sesiones por da.  http://[email or web address].exer.us/82   Copyright  VHI. All rights reserved.  Cane Exercise: Flexion    [place] sobre la espalda sosteniendo el bastn arriba de la cara. Brazos lo ms estirados que pueda. Baje el bastn hacia el suelo por encima de la cabeza. Intente mantener los codos extendidos. Mantenga ___3_ segundos. Repita __10__ veces. Haga __2__ sesiones por da.  http://[email or web address].exer.us/92   Copyright  VHI. All rights reserved.  Scapular Retraction (Standing)    Con brazos a los lados del cuerpo, [employer]. Repita __10__ veces por rutina. Realice __3__ Albertina [place] por sesin. Realice __3__ sesiones por da.  http://orth.exer.us/945   Copyright  VHI. All rights reserved.  Flexibility: Equities trader, en una esquina con las manos ligeramente por encima de la altura de los hombros y los pies a __20__ cm. de la esquina inclnese hacia adelante hasta sentir un estiramiento a travs del pecho. Sostenga __10__ segundos. Repita __3__ Vicenta Aly por rutina. Realice __8__ Albertina [place] por sesin. Realice __2__ sesiones por da.  Closed Chain: Shoulder Abduction / Adduction - on Wall    [employer] pared, de un paso hacia un lado y regrese. El paso hacia al lado causa la abduccin y aduccin del hombro. De un paso _5__ veces, a cada lado, _2__ veces por da.     http://ss.exer.us/267   Copyright  VHI. All rights reserved.   Closed Chain: Shoulder Flexion / Extension - on [employer] pared, de un paso hacia atrs. Regrese. Este movimiento causa la flexin y extensin del hombro. Realice _5__ veces, con cada pie, _2__ veces por da.  http://ss.exer.us/265   Copyright  VHI. All rights reserved.

## 2017-01-28 ENCOUNTER — Other Ambulatory Visit: Payer: Self-pay | Admitting: *Deleted

## 2017-01-28 ENCOUNTER — Ambulatory Visit: Payer: Self-pay

## 2017-01-28 DIAGNOSIS — G8929 Other chronic pain: Secondary | ICD-10-CM

## 2017-01-28 DIAGNOSIS — Z170 Estrogen receptor positive status [ER+]: Principal | ICD-10-CM

## 2017-01-28 DIAGNOSIS — R293 Abnormal posture: Secondary | ICD-10-CM

## 2017-01-28 DIAGNOSIS — M25511 Pain in right shoulder: Secondary | ICD-10-CM

## 2017-01-28 DIAGNOSIS — R29898 Other symptoms and signs involving the musculoskeletal system: Secondary | ICD-10-CM

## 2017-01-28 DIAGNOSIS — C50411 Malignant neoplasm of upper-outer quadrant of right female breast: Secondary | ICD-10-CM

## 2017-01-28 DIAGNOSIS — M25611 Stiffness of right shoulder, not elsewhere classified: Secondary | ICD-10-CM

## 2017-01-28 NOTE — Therapy (Addendum)
[place], [place], [address] Phone: [phone]   Fax:  [phone]  Physical Therapy Treatment  Patient Details  Name: Kayla Johnston MRN: [id number] Date of Birth: [birth year] Referring Provider: Wilber Bihari, NP  Encounter Date: 01/28/2017      PT End of Session - 01/28/17 1209    Visit Number 2   Number of Visits 4   Date for PT Re-Evaluation 02/18/17   PT Start Time 1107   PT Stop Time 1158   PT Time Calculation (min) 51 min   Activity Tolerance Patient tolerated treatment well   Behavior During Therapy [facility] for tasks assessed/performed      Past Medical History:  Diagnosis Date  . Breast cancer ([place]) right  . Breast cancer of upper-outer quadrant of right female breast ([place]) 07/05/2015  . Right renal mass   . Splinter    RIGHT 3RD FINGER X 1 WEEK, PT TO REMOVE ASAP.    Past Surgical History:  Procedure Laterality Date  . APPENDECTOMY    . BREAST BIOPSY     biopsy  . ROBOTIC ASSITED PARTIAL NEPHRECTOMY Right 12/30/2015   Procedure: XI ROBOTIC ASSITED PARTIAL NEPHRECTOMY WITH ULTRASOUND;  Surgeon: Alexis Frock, MD;  [facility];  Service: Urology;  Laterality: Right;  . SIMPLE MASTECTOMY Left 08/23/2015  . SIMPLE MASTECTOMY W/ SENTINEL NODE BIOPSY Right 08/23/2015  . SIMPLE MASTECTOMY WITH AXILLARY SENTINEL NODE BIOPSY Right 08/23/2015   Procedure: RIGHT SIMPLE MASTECTOMY WITH RIGHT SENTINEL LYMPH  NODE MAPPING;  Surgeon: Erroll Luna, MD;  Location: [facility];  Service: General;  Laterality: Right;  . SIMPLE MASTECTOMY WITH AXILLARY SENTINEL NODE BIOPSY Left 08/23/2015   Procedure: Left SIMPLE MASTECTOMY,prophylactic;  Surgeon: Erroll Luna, MD;  Location: [facility];  Service: General;  Laterality: Left;    There were no vitals filed for this visit.      Subjective Assessment - 01/28/17 1113    Subjective My Rt chest is alot better since I was here last, I am very happy  about that. I can touch my chest now and I haven't had any pain since! I am still having alot of bleeding/irregular periods and abdominal pain. I am going to call today with the interpreter here and see if they can see me soon fora pap smear. I was called awhile ago (she isn't sure how long ago, a few months maybe) and I was offered an appointment on Sunday but that sounded weird to me so I didn't take it and haven't heard anything since then. I got my knitted knockers and I really like those as well.    Pertinent History Bilateral mastectomy 08/23/15 with right sentinel node biopsy. No chemo or radiation needed. Right kidney benign mass removed 6/17.   Patient Stated Goals Decrease pain   Currently in Pain? No/denies            [facility] PT Assessment - 01/28/17 0001      AROM   Right Shoulder Extension 44 Degrees   Right Shoulder Flexion 154 Degrees   Right Shoulder ABduction 171 Degrees   Right Shoulder Internal Rotation 74 Degrees   Right Shoulder External Rotation 82 Degrees                     [facility] Adult PT Treatment/Exercise - 01/28/17 0001      Shoulder Exercises: Supine   Horizontal ABduction Strengthening;Both;10 reps;Theraband   Theraband Level (Shoulder Horizontal ABduction) Level 2 (Red)  External Rotation Strengthening;Both;10 reps;Theraband   Theraband Level (Shoulder External Rotation) Level 2 (Red)   Flexion Strengthening;Both;10 reps;Theraband  Narrow and Wide Grip, 10 times each   Theraband Level (Shoulder Flexion) Level 2 (Red)   Other Supine Exercises Bil D2 with red theraband 10 times each side with pt returning therapsit demonstration.    Other Supine Exercises Supine on towel roll for pectoralis stretch with UE's in horizontal abduction for x1 minute also instructed pt in this for HEP.     Shoulder Exercises: Pulleys   Flexion 2 minutes   ABduction 2 minutes   ABduction Limitations Pt able to return proper demonstration of this today      Shoulder Exercises: Therapy Ball   Flexion 10 reps  With forward lean into end of stretch   ABduction 10 reps  Rt UE with side lean into end of stretch     Manual Therapy   Manual Therapy Myofascial release;Passive ROM   Myofascial Release To Rt chest wall in supine with cross hands technique vertically and horizontally (also did horizontal to bil chest)   Passive ROM To Rt shoulder into flexion, abduction and D2 to tolerance                PT Education - 01/28/17 1141    Education provided Yes   Education Details Supine scapular series with red theraband   Person(s) Educated Patient;Other (comment)  Interpreter present   Methods Explanation;Demonstration;Handout   Comprehension Verbalized understanding;Returned demonstration;Need further instruction                [facility] Goals - 01/28/17 1221      CC Long Term Goal  #1   Title Patient will report a 50% decrease in the pain in her upper back and chest.   Baseline Pt did not rate this but reports much improved, in fact only slightly sensitive now-01/28/17   Status Achieved     CC Long Term Goal  #2   Title Patient will be independent in HEP for stretching postural and scapular muscles.   Status On-going     CC Long Term Goal  #3   Title Patient will verbalize understanding of lympehdema risk reduction practices.   Baseline Pt attended ABC class last week and did not have any follow up questions today-01/28/17   Status Achieved     CC Long Term Goal  #4   Title Improve Quick DASH score to </= 30 for improved overall arm function.   Status On-going            Plan - 01/28/17 1210    Clinical Impression Statement Pt has made great progress since first visit last week. She reports is able to touch her chest now with very little sensitivity and her Rt shoulder A/ROM has improved as well. Her knitted knockers came in the mail and she has been wearing them and really likes them alot. She did well with  instruction and progression of supine scapular series today so issued to HEP. She has yet to call to make an appointment to get measured for a compression sleeve but plans to do this in next day or 2.    Rehab Potential Good   Clinical Impairments Affecting Rehab Potential Limited PT visits   PT Frequency 1x / week   PT Duration 4 weeks   PT Treatment/Interventions ADLs/Self Care Home Management;Therapeutic exercise;Therapeutic activities;DME Instruction;Patient/family education;Manual techniques;Scar mobilization;Passive range of motion   PT Next Visit Plan Cont with  ROM exercises and chest opening stretches; review postural exercises. See if pt got measured at [place] yet Armed forces operational officer). Probable D/C next visit as pt has made great progress since last week.    PT Home Exercise Plan Cont initial HEP and add supine scapular series and supine on towel roll   Recommended Other Services Interpreter called nurse navigator Bary Castilla for pt before leaving today to see if pt could get R/S for a pap smear.   Consulted and Agree with Plan of Care Patient;Family member/caregiver   Family Building surveyor and husband      Patient will benefit from skilled therapeutic intervention in order to improve the following deficits and impairments:  Impaired sensation, Pain, Impaired UE functional use, Decreased scar mobility, Decreased knowledge of precautions, Decreased range of motion, Postural dysfunction  Visit Diagnosis: Abnormal posture  Chronic right shoulder pain  Stiffness of right shoulder, not elsewhere classified  Other symptoms and signs involving the musculoskeletal system     Problem List Patient Active Problem List   Diagnosis Date Noted  . Renal mass 12/30/2015  . Right renal mass 11/23/2015  . Breast cancer of upper-outer quadrant of right female breast ([place]) 07/05/2015    Otelia Limes, PTA 01/28/2017, 12:24 PM  [place], [place], [address] Phone: [phone]   Fax:  [phone]  Name: Avalin Briley MRN: [id number] Date of Birth: 1966-06-13  PHYSICAL THERAPY DISCHARGE SUMMARY  Visits from Start of Care: 2  Current functional level related to goals / functional outcomes: Unknown as pt has not returned to PT    Remaining deficits: Unknown as pt has not returned to PT   Education / Equipment: Lymphedema risk reduction information and instruction in a HEP Plan: Patient agrees to discharge.  Patient goals were partially met. Patient is being discharged due to meeting the stated rehab goals.  ?????     Annia Friendly, [place] 09/11/17 10:34 AM

## 2017-01-28 NOTE — Patient Instructions (Addendum)
Over Head Pull: Narrow and Wide Grip   Cancer Rehab [phone]   On back, knees bent, feet flat, band across thighs, elbows straight but relaxed. Pull hands apart (start). Keeping elbows straight, bring arms up and over head, hands toward floor. Keep pull steady on band. Hold momentarily. Return slowly, keeping pull steady, back to start. Then do same with a wider grip on the band (past shoulder width) Repeat _10__ times. Band color __red____   Side Pull: Double Arm   On back, knees bent, feet flat. Arms perpendicular to body, shoulder level, elbows straight but relaxed. Pull arms out to sides, elbows straight. Resistance band comes across collarbones, hands toward floor. Hold momentarily. Slowly return to starting position. Repeat _10__ times. Band color _red____   Sword   On back, knees bent, feet flat, left hand on left hip, right hand above left. Pull right arm DIAGONALLY (hip to shoulder) across chest. Bring right arm along head toward floor. Hold momentarily. Slowly return to starting position. Repeat _10__ times. Do with left arm. Band color _red_____   Shoulder Rotation: Double Arm   On back, knees bent, feet flat, elbows tucked at sides, bent 90, hands palms up. Pull hands apart and down toward floor, keeping elbows near sides. Hold momentarily. Slowly return to starting position. Repeat _10__ times. Band color __red____    Pectoralis Stretch With Scapula Adduction: Supine (Towel Roll)    Acostado sobre un rollo de toalla vertical. Suavemente junte los omoplatos. Realice _1__ veces, _5-8__ veces por da.   Slide arms out to sides keeping them on floor, hold when stretch felt in chest for 5 slow breaths.

## 2017-01-30 ENCOUNTER — Telehealth: Payer: Self-pay | Admitting: Obstetrics and Gynecology

## 2017-01-30 NOTE — Telephone Encounter (Signed)
Called and left a message for patient to call back to schedule a new patient doctor referral. Called with interpreter, [place], [phone]

## 2017-02-04 NOTE — Telephone Encounter (Signed)
Called and left a message for patient to call back to schedule a new patient doctor referral. Called with interpreter, Amy [phone]

## 2017-02-06 NOTE — Telephone Encounter (Signed)
Called and left a message for patient to call back to schedule a new patient doctor referral.  with interpreter, Hollie Salk, [phone]

## 2017-02-07 NOTE — Telephone Encounter (Signed)
Routing back to referring office because patient has not returned calls to schedule an appointment.

## 2017-02-11 ENCOUNTER — Ambulatory Visit (INDEPENDENT_AMBULATORY_CARE_PROVIDER_SITE_OTHER): Payer: Self-pay | Admitting: Physician Assistant

## 2017-02-11 ENCOUNTER — Encounter: Payer: Self-pay | Admitting: Physician Assistant

## 2017-02-11 VITALS — BP 144/82 | HR 90 | Temp 97.7°F | Resp 18 | Ht 61.42 in | Wt 168.0 lb

## 2017-02-11 DIAGNOSIS — N9489 Other specified conditions associated with female genital organs and menstrual cycle: Secondary | ICD-10-CM

## 2017-02-11 DIAGNOSIS — N926 Irregular menstruation, unspecified: Secondary | ICD-10-CM

## 2017-02-11 DIAGNOSIS — R102 Pelvic and perineal pain: Secondary | ICD-10-CM

## 2017-02-11 DIAGNOSIS — R8299 Other abnormal findings in urine: Secondary | ICD-10-CM

## 2017-02-11 DIAGNOSIS — R82998 Other abnormal findings in urine: Secondary | ICD-10-CM

## 2017-02-11 DIAGNOSIS — Z124 Encounter for screening for malignant neoplasm of cervix: Secondary | ICD-10-CM

## 2017-02-11 LAB — POCT CBC
GRANULOCYTE PERCENT: 53.9 % (ref 37–80)
HCT, POC: 38.1 % (ref 37.7–47.9)
HEMOGLOBIN: 13.1 g/dL (ref 12.2–16.2)
Lymph, poc: 2.1 (ref 0.6–3.4)
MCH: 29.7 pg (ref 27–31.2)
MCHC: 34.3 g/dL (ref 31.8–35.4)
MCV: 86.6 fL (ref 80–97)
MID (CBC): 0.3 (ref 0–0.9)
MPV: 8.4 fL (ref 0–99.8)
POC Granulocyte: 2.8 (ref 2–6.9)
POC LYMPH PERCENT: 40.1 %L (ref 10–50)
POC MID %: 6 % (ref 0–12)
Platelet Count, POC: 221 10*3/uL (ref 142–424)
RBC: 4.4 M/uL (ref 4.04–5.48)
RDW, POC: 13.2 %
WBC: 5.2 10*3/uL (ref 4.6–10.2)

## 2017-02-11 LAB — POCT URINALYSIS DIP (MANUAL ENTRY)
BILIRUBIN UA: NEGATIVE
BILIRUBIN UA: NEGATIVE mg/dL
Glucose, UA: NEGATIVE mg/dL
NITRITE UA: NEGATIVE
PH UA: 5.5 (ref 5.0–8.0)
Protein Ur, POC: NEGATIVE mg/dL
Spec Grav, UA: 1.025 (ref 1.010–1.025)
Urobilinogen, UA: 0.2 E.U./dL

## 2017-02-11 LAB — POC MICROSCOPIC URINALYSIS (UMFC): Mucus: ABSENT

## 2017-02-11 LAB — POCT URINE PREGNANCY: Preg Test, Ur: NEGATIVE

## 2017-02-11 MED ORDER — CELECOXIB 200 MG PO CAPS: ORAL_CAPSULE | ORAL | 0 refills | Status: AC

## 2017-02-11 NOTE — Patient Instructions (Addendum)
For abdominal pain, your labs in office were reassuring. We have sent your other labs out and they should return in 4-5 days. I recommend you get an ultrasound of you uterus and ovaries for further evaluation. Someone will call you with an appointment date and time to have the ultrasound.While awaiting the ultrasound, please take celebrex as prescribed for pain. If any of your symptoms worsen or you develop new concerning symptoms like nausea, vomiting, fever, and chills please seek care immediately. I have also put in a referral to gyencology and they should contact you with an appointment in 1-2 weeks. Please follow up with them for further gynecology complaints.     IF you received an x-ray today, you will receive an invoice from [facility]. Please contact [facility] at [phone] with questions or concerns regarding your invoice.   IF you received labwork today, you will receive an invoice from [place]. Please contact [facility] at [phone] with questions or concerns regarding your invoice.   Our billing staff will not be able to assist you with questions regarding bills from these companies.  You will be contacted with the lab results as soon as they are available. The fastest way to get your results is to activate your My Chart account. Instructions are located on the last page of this paperwork. If you have not heard from [place] regarding the results in 2 weeks, please contact this office.

## 2017-02-11 NOTE — Progress Notes (Signed)
Kayla Johnston  MRN: [id number] DOB: October 01, 1965  Subjective:  Kayla Johnston is a 51 y.o. female, G3P0030, seen in office today for a chief complaint of suprapubic pain x 2 weeks. Has associated hematuria. Denies dysuria, urinary frequency, urgency, vaginal odor, vaginal itching, nausea, vomiting, diarrhea, and constipation. Pt also endorses irregular cycles. Notes they are typically regular but she skipped one this past month and has now only been lightly spotting for the apst 2 weeks. Pt is not sexually active. Last pap smear > 3 years ago, was normal.  No hx of uterine fibroids, ovarian cysts, or endometriosis. Past abdominal surgeries include appendectomy. Denies alcohol use. Has tried advil with moderate relief.  Review of Systems  Constitutional: Positive for unexpected weight change (weight gain). Negative for activity change, appetite change, diaphoresis and fatigue.  Eyes: Negative for visual disturbance.  Cardiovascular: Negative for chest pain and palpitations.  Gastrointestinal: Negative for blood in stool and rectal pain.  Endocrine: Negative for heat intolerance.  Neurological: Negative for dizziness and light-headedness.  Psychiatric/Behavioral: Positive for sleep disturbance.    Patient Active Problem List   Diagnosis Date Noted  . Renal mass 12/30/2015  . Right renal mass 11/23/2015  . Breast cancer of upper-outer quadrant of right female breast ([place]) 07/05/2015    Current Outpatient Prescriptions on File Prior to Visit  Medication Sig Dispense Refill  . acetaminophen (TYLENOL) 500 MG tablet Take 500 mg by mouth every 6 (six) hours as needed for moderate pain.    . [DISCONTINUED] cetirizine (ZYRTEC) 10 MG tablet Take 1 tablet (10 mg total) by mouth daily. (Patient not taking: Reported on 06/25/2015) 30 tablet 11  . [DISCONTINUED] ranitidine (ZANTAC) 150 MG tablet Take 1 tablet (150 mg total) by mouth 2 (two) times daily. 60 tablet 0   No current  facility-administered medications on file prior to visit.     No Known Allergies   Objective:  BP (!) 144/82   Pulse 90   Temp 97.7 F (36.5 C) (Oral)   Resp 18   Ht 5' 1.42" (1.56 m)   Wt 168 lb (76.2 kg)   SpO2 98%   BMI 31.31 kg/m   Physical Exam  Constitutional: She is oriented to person, place, and time and well-developed, well-nourished, and in no distress.  HENT:  Head: Normocephalic and atraumatic.  Eyes: Conjunctivae are normal.  Neck: Normal range of motion.  Pulmonary/Chest: Effort normal.  Abdominal: Soft. Normal appearance and bowel sounds are normal. She exhibits no distension. There is tenderness (mild in suprapubic area). There is no rigidity, no guarding, no CVA tenderness, no tenderness at McBurney's point and negative Murphy's sign.  Genitourinary: Cervix normal, right adnexa normal and left adnexa normal. Uterus is tender. Right adnexum displays no mass and no tenderness. Left adnexum displays no mass and no tenderness. Thin  bloody  red and vaginal discharge found.  Genitourinary Comments: Bloody discharge noted exiting the cervical os.   Neurological: She is alert and oriented to person, place, and time. Gait normal.  Skin: Skin is warm and dry.  Psychiatric: Affect normal.  Vitals reviewed.   Results for orders placed or performed in visit on 02/11/17 (from the past 24 hour(s))  POCT urinalysis dipstick     Status: Abnormal   Collection Time: 02/11/17  2:37 PM  Result Value Ref Range   Color, UA yellow yellow   Clarity, UA clear clear   Glucose, UA negative negative mg/dL   Bilirubin, UA negative negative   Ketones, POC  UA negative negative mg/dL   Spec Grav, UA 1.025 1.010 - 1.025   Blood, UA moderate (A) negative   pH, UA 5.5 5.0 - 8.0   Protein Ur, POC negative negative mg/dL   Urobilinogen, UA 0.2 0.2 or 1.0 E.U./dL   Nitrite, UA Negative Negative   Leukocytes, UA Trace (A) Negative  POCT Microscopic Urinalysis ([facility])     Status: Abnormal    Collection Time: 02/11/17  2:43 PM  Result Value Ref Range   WBC,UR,HPF,POC Few (A) None WBC/hpf   RBC,UR,HPF,POC None None RBC/hpf   Bacteria Moderate (A) None, Too numerous to count   Mucus Absent Absent   Epithelial Cells, UR Per Microscopy Few (A) None, Too numerous to count cells/hpf  POCT CBC     Status: None   Collection Time: 02/11/17  2:55 PM  Result Value Ref Range   WBC 5.2 4.6 - 10.2 K/uL   Lymph, poc 2.1 0.6 - 3.4   POC LYMPH PERCENT 40.1 10 - 50 %L   MID (cbc) 0.3 0 - 0.9   POC MID % 6.0 0 - 12 %M   POC Granulocyte 2.8 2 - 6.9   Granulocyte percent 53.9 37 - 80 %G   RBC 4.40 4.04 - 5.48 M/uL   Hemoglobin 13.1 12.2 - 16.2 g/dL   HCT, POC 38.1 37.7 - 47.9 %   MCV 86.6 80 - 97 fL   MCH, POC 29.7 27 - 31.2 pg   MCHC 34.3 31.8 - 35.4 g/dL   RDW, POC 13.2 %   Platelet Count, POC 221 142 - 424 K/uL   MPV 8.4 0 - 99.8 fL  POCT urine pregnancy     Status: None   Collection Time: 02/11/17  3:19 PM  Result Value Ref Range   Preg Test, Ur Negative Negative   Wt Readings from Last 3 Encounters:  02/11/17 168 lb (76.2 kg)  01/16/17 166 lb 1.6 oz (75.3 kg)  12/30/15 163 lb (73.9 kg)    Assessment and Plan :  1. Suprapubic abdominal pain - POCT urinalysis dipstick - POCT Microscopic Urinalysis ([facility]) 2. Irregular menstrual cycle Likely perimenopausal. Negative pregnancy test. Will refer for gynecology at this time.  - POCT urine pregnancy - Ambulatory referral to Gynecology 3. Uterine pain PE findings with TTP of uterus. Vitals stable. WBC normal, UA with trace leukocytes. Urine culture pending. Unclear etiology at this time. Pt would benefit from imaging to further evaluate GU etiology. Will give Rx for celebrex while awaiting [place]. Pt given strict ED precautions.  - POCT CBC - US Transvaginal Non-OB; Future - US Pelvis Complete; Future - celecoxib (CELEBREX) 200 MG capsule; Take 400mg  x 1 day, then take 200mg  daily until pain resolves.  Dispense: 20 capsule; Refill:  0  4. Screening for cervical cancer - Pap IG and HPV (high risk) DNA detection  5. Leukocytes in urine Culture pending - Urine Culture  Tenna Delaine, PA-C  [facility] [place] 02/12/2017 11:12 AM

## 2017-02-13 LAB — PAP IG AND HPV HIGH-RISK
HPV, HIGH-RISK: NEGATIVE
PAP Smear Comment: 0

## 2017-02-13 LAB — URINE CULTURE

## 2017-02-15 NOTE — Progress Notes (Signed)
  Attempted to reach patient on both contact numbers via [place] [phone], no response, left message of upcoming [place] appointment 02/18/17 @ [facility] at 12pm; arrival time 1145am.  Also left message on niece Rubie vm who speaks English to return message.  Will try again

## 2017-02-17 NOTE — Progress Notes (Deleted)
Kayla Johnston Follow up:    Patient, No Pcp Per No address on file   DIAGNOSIS: Johnston Staging Breast Johnston of upper-outer quadrant of right female breast [facility]) Staging form: Breast, AJCC 7th Edition - Clinical stage from 07/06/2015: Stage IIA (T2, N0, M0) - Unsigned - Pathologic stage from 08/23/2015: Stage IA (T1b, N0, cM0) - Unsigned   SUMMARY OF ONCOLOGIC HISTORY:   Breast Johnston of upper-outer quadrant of right female breast ([place])   06/29/2015 Mammogram    Right breast: oval mass with calcs UOQ, middle depth.  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 0/3 LN positive      08/23/2015 Pathologic Stage    Stage IA: T1b N0      11/23/2015 -  Anti-estrogen oral therapy    Tamoxifen 20 mg daily       Survivorship    SCP (in [place]) mailed to patient after her missing her appointment       CURRENT THERAPY: Tamoxifen daily  INTERVAL HISTORY: [name] 51 y.o. female returns for    Patient Active Problem List   Diagnosis Date Noted  . Renal mass 12/30/2015  . Right renal mass 11/23/2015  . Breast Johnston of upper-outer quadrant of right female breast ([place]) 07/05/2015    has No Known Allergies.  MEDICAL HISTORY: Past Medical History:  Diagnosis Date  . Breast Johnston ([place]) right  . Breast Johnston of upper-outer quadrant of right female breast ([place]) 07/05/2015  . Right renal mass   . Splinter    RIGHT 3RD FINGER X 1 WEEK, PT TO REMOVE ASAP.    SURGICAL HISTORY: Past Surgical History:  Procedure Laterality Date  . APPENDECTOMY    . BREAST BIOPSY     biopsy  . ROBOTIC ASSITED PARTIAL NEPHRECTOMY Right 12/30/2015   Procedure: XI ROBOTIC ASSITED PARTIAL NEPHRECTOMY WITH ULTRASOUND;  Surgeon: Alexis Frock, MD;  Location: [facility];  Service: Urology;  Laterality: Right;  . SIMPLE MASTECTOMY Left 08/23/2015  . SIMPLE MASTECTOMY W/ SENTINEL NODE BIOPSY Right 08/23/2015  . SIMPLE MASTECTOMY WITH AXILLARY SENTINEL NODE BIOPSY Right 08/23/2015   Procedure: RIGHT SIMPLE MASTECTOMY WITH RIGHT SENTINEL LYMPH  NODE MAPPING;  Surgeon: Erroll Luna, MD;  Location: [facility];  Service: General;  Laterality: Right;  . SIMPLE MASTECTOMY WITH AXILLARY SENTINEL NODE BIOPSY Left 08/23/2015   Procedure: Left SIMPLE MASTECTOMY,prophylactic;  Surgeon: Erroll Luna, MD;  Location: [facility];  Service: General;  Laterality: Left;    SOCIAL HISTORY: Social History   Social History  . Marital status: Married    Spouse name: N/A  . Number of children: N/A  . Years of education: N/A   Occupational History  . Not on file.   Social History Main Topics  . Smoking status: Never Smoker  . Smokeless tobacco: Never Used  . Alcohol use No  . Drug use: No  . Sexual activity: Yes    Birth control/ protection: None   Other Topics Concern  . Not on file   Social History Narrative  . No narrative on file    FAMILY HISTORY: Family History  Problem Relation Age of Onset  .  Johnston Sister        cervical    Review of Systems - Oncology    PHYSICAL EXAMINATION  ECOG PERFORMANCE STATUS: {CHL ONC ECOG [id number]}  There were no vitals filed for this visit.  Physical Exam  LABORATORY DATA:  CBC    Component Value Date/Time   WBC 5.2 02/11/2017 1455   WBC 5.4 12/22/2015 1400   RBC 4.40 02/11/2017 1455   RBC 4.46 12/22/2015 1400   HGB 13.1 02/11/2017 1455   HGB 10.5 (L) 12/31/2015 0520   HGB 12.6 09/21/2015 1032   HCT 38.1 02/11/2017 1455   HCT 31.2 (L) 12/31/2015 0520   HCT 38.7 09/21/2015 1032   PLT 185 12/22/2015 1400   PLT 259 09/21/2015 1032   MCV 86.6 02/11/2017 1455   MCV 90.6 09/21/2015 1032   MCH  29.7 02/11/2017 1455   MCH 28.7 12/22/2015 1400   MCHC 34.3 02/11/2017 1455   MCHC 34.1 12/22/2015 1400   RDW 12.6 12/22/2015 1400   RDW 14.3 09/21/2015 1032   LYMPHSABS 1.7 09/21/2015 1032   MONOABS 0.3 09/21/2015 1032   EOSABS 0.1 09/21/2015 1032   BASOSABS 0.0 09/21/2015 1032    CMP     Component Value Date/Time   NA 137 12/31/2015 0520   NA 140 09/21/2015 1032   K 4.1 12/31/2015 0520   K 3.9 09/21/2015 1032   CL 105 12/31/2015 0520   CO2 26 12/31/2015 0520   CO2 26 09/21/2015 1032   GLUCOSE 164 (H) 12/31/2015 0520   GLUCOSE 137 09/21/2015 1032   BUN 11 12/31/2015 0520   BUN 8.7 09/21/2015 1032   CREATININE 0.90 12/31/2015 0520   CREATININE 0.8 09/21/2015 1032   CALCIUM 8.2 (L) 12/31/2015 0520   CALCIUM 9.1 09/21/2015 1032   PROT 7.1 11/05/2015 0834   PROT 7.0 09/21/2015 1032   ALBUMIN 3.9 11/05/2015 0834   ALBUMIN 3.6 09/21/2015 1032   AST 26 11/05/2015 0834   AST 39 (H) 09/21/2015 1032   ALT 26 11/05/2015 0834   ALT 65 (H) 09/21/2015 1032   ALKPHOS 74 11/05/2015 0834   ALKPHOS 89 09/21/2015 1032   BILITOT 0.6 11/05/2015 0834   BILITOT 0.42 09/21/2015 1032   GFRNONAA >60 12/31/2015 0520   GFRAA >60 12/31/2015 0520       PENDING LABS:   RADIOGRAPHIC STUDIES:  No results found.   PATHOLOGY:     ASSESSMENT and THERAPY PLAN:   No problem-specific Assessment & Plan notes found for this encounter.   No orders of the defined types were placed in this encounter.   All questions were answered. The patient knows to call the clinic with any problems, questions or concerns. We can certainly see the patient much sooner if necessary. This note was electronically signed. Scot Dock, NP 02/17/2017

## 2017-02-18 ENCOUNTER — Ambulatory Visit (HOSPITAL_COMMUNITY): Admission: RE | Admit: 2017-02-18 | Payer: Self-pay | Source: Ambulatory Visit

## 2017-02-18 ENCOUNTER — Ambulatory Visit: Payer: Self-pay | Admitting: Adult Health

## 2017-09-06 NOTE — Progress Notes (Signed)
This encounter was created in error - please disregard.

## 2018-12-16 IMAGING — DX DG CERVICAL SPINE 2 OR 3 VIEWS
3 series · 3 of 3 positions shown · non-contrast
Comparison: CT 11/05/2015.

CLINICAL DATA: Neck and back pain.  No known injury.

EXAM:
CERVICAL SPINE - 2-3 VIEW

[c-spine lat]
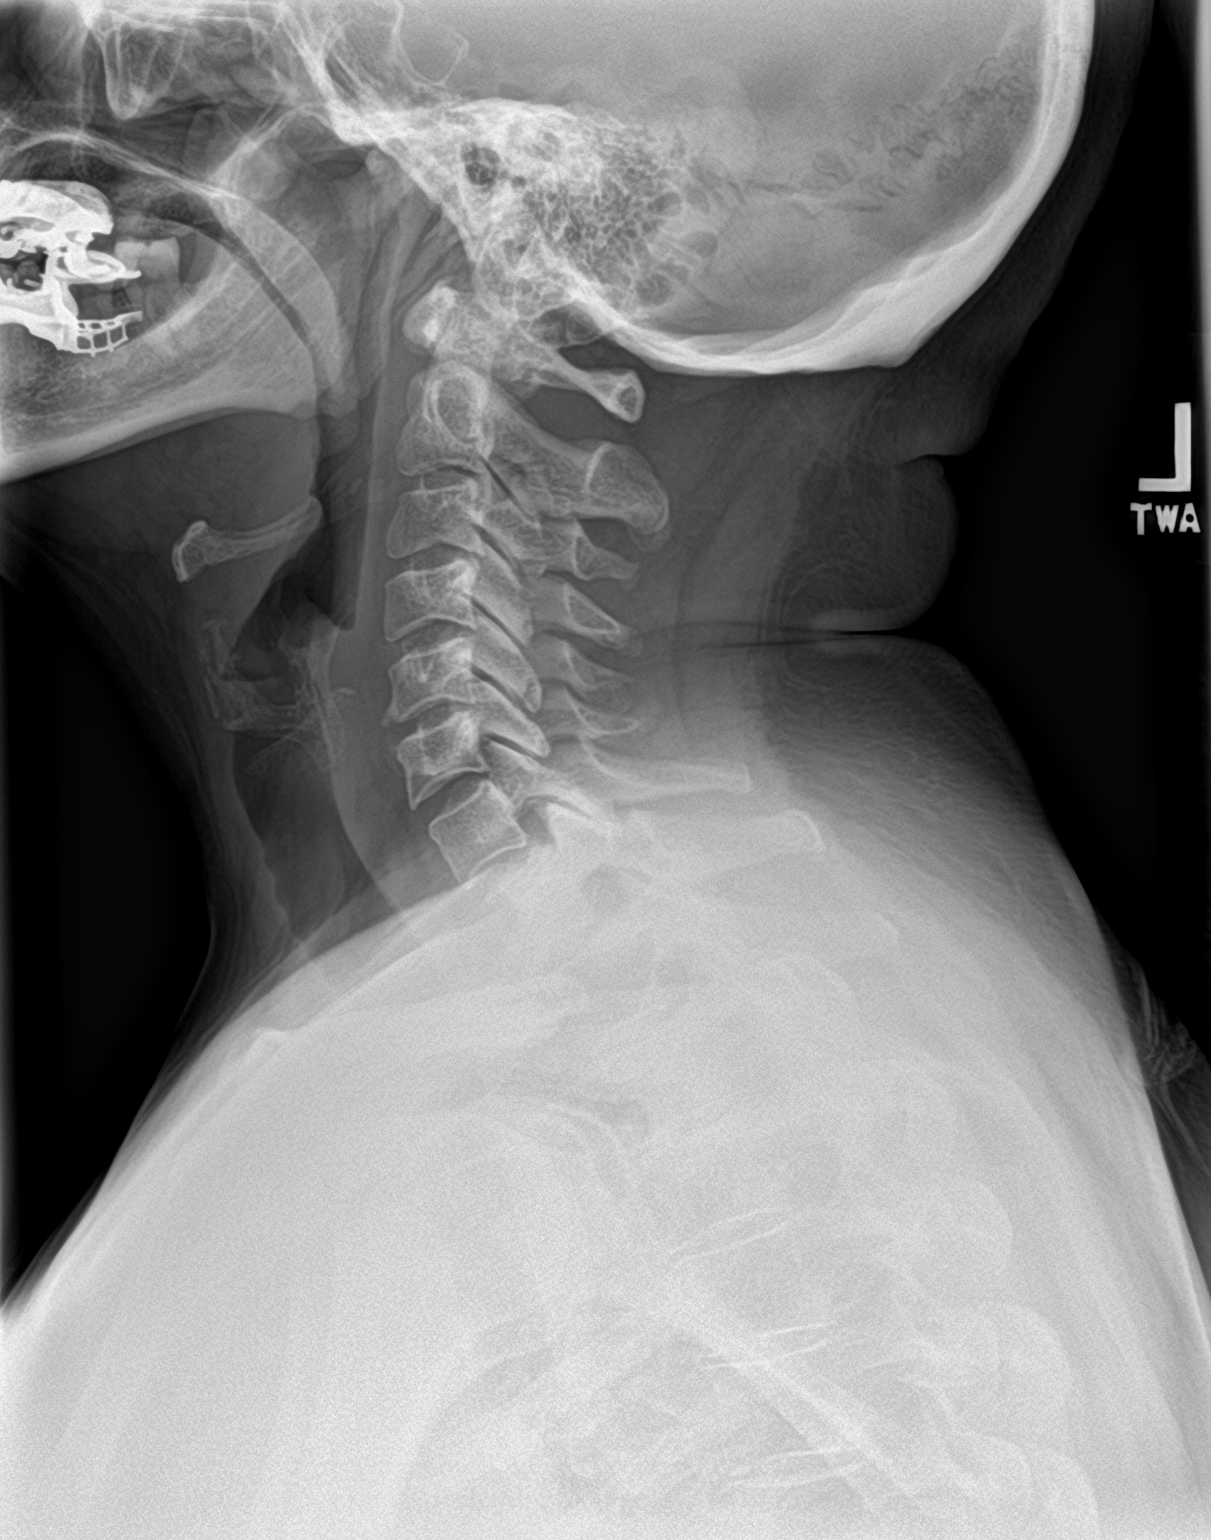

[c-spine ap]
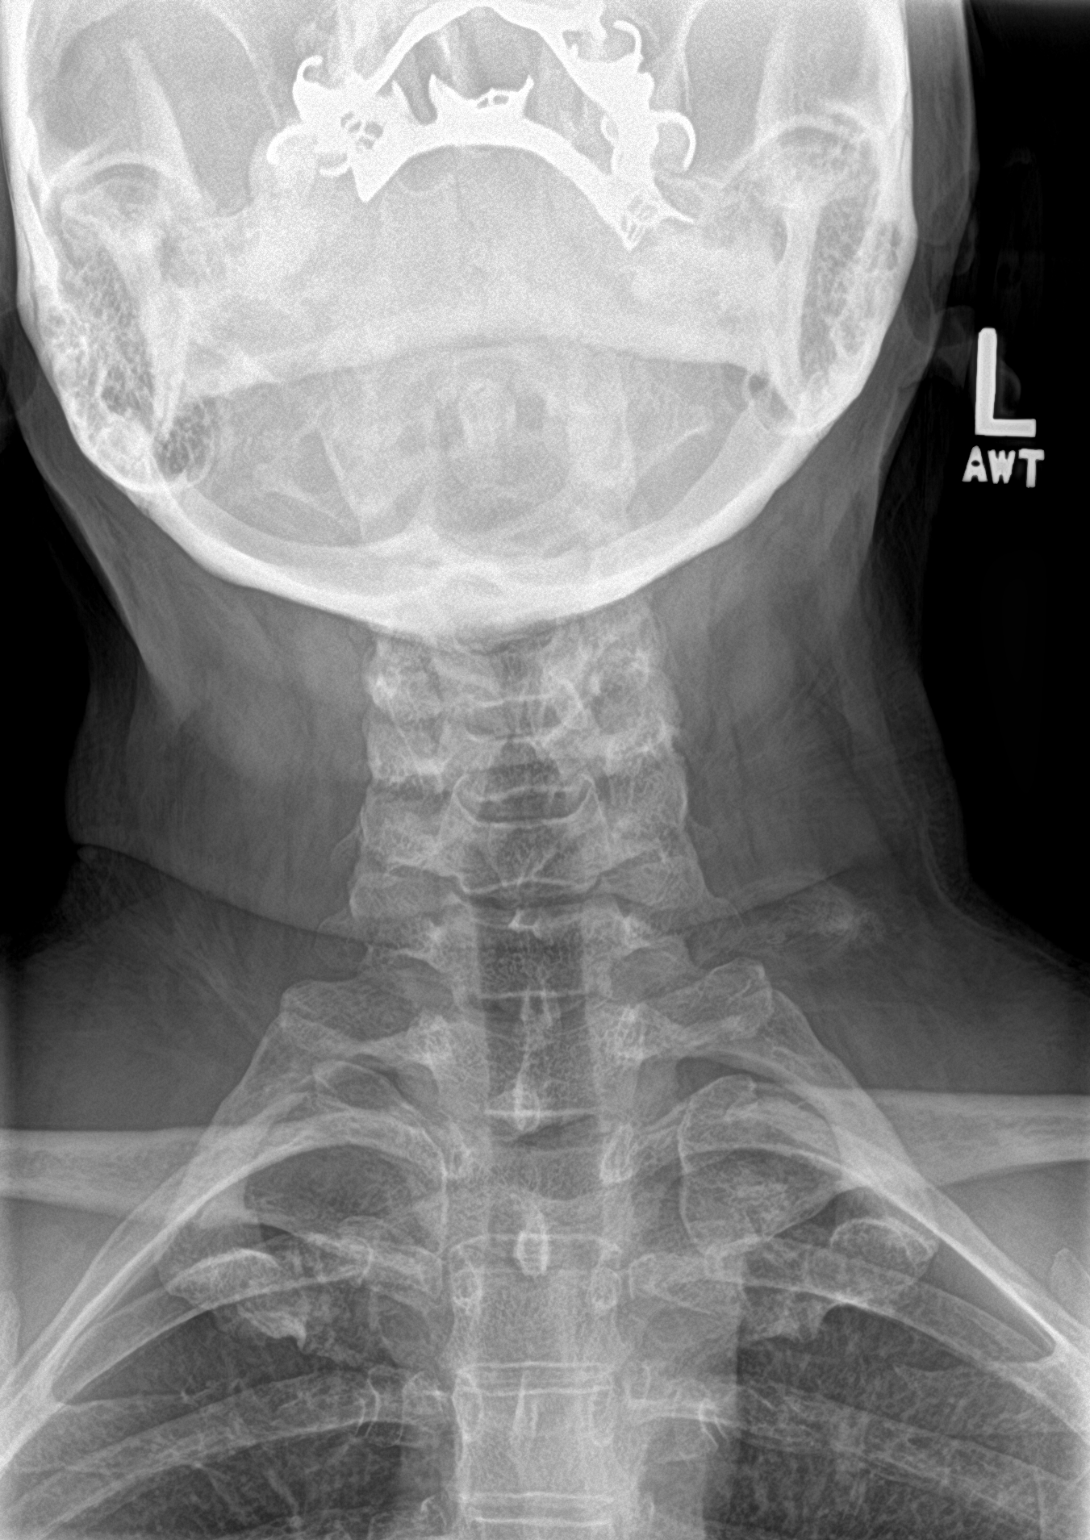

[c-spine open mouth]
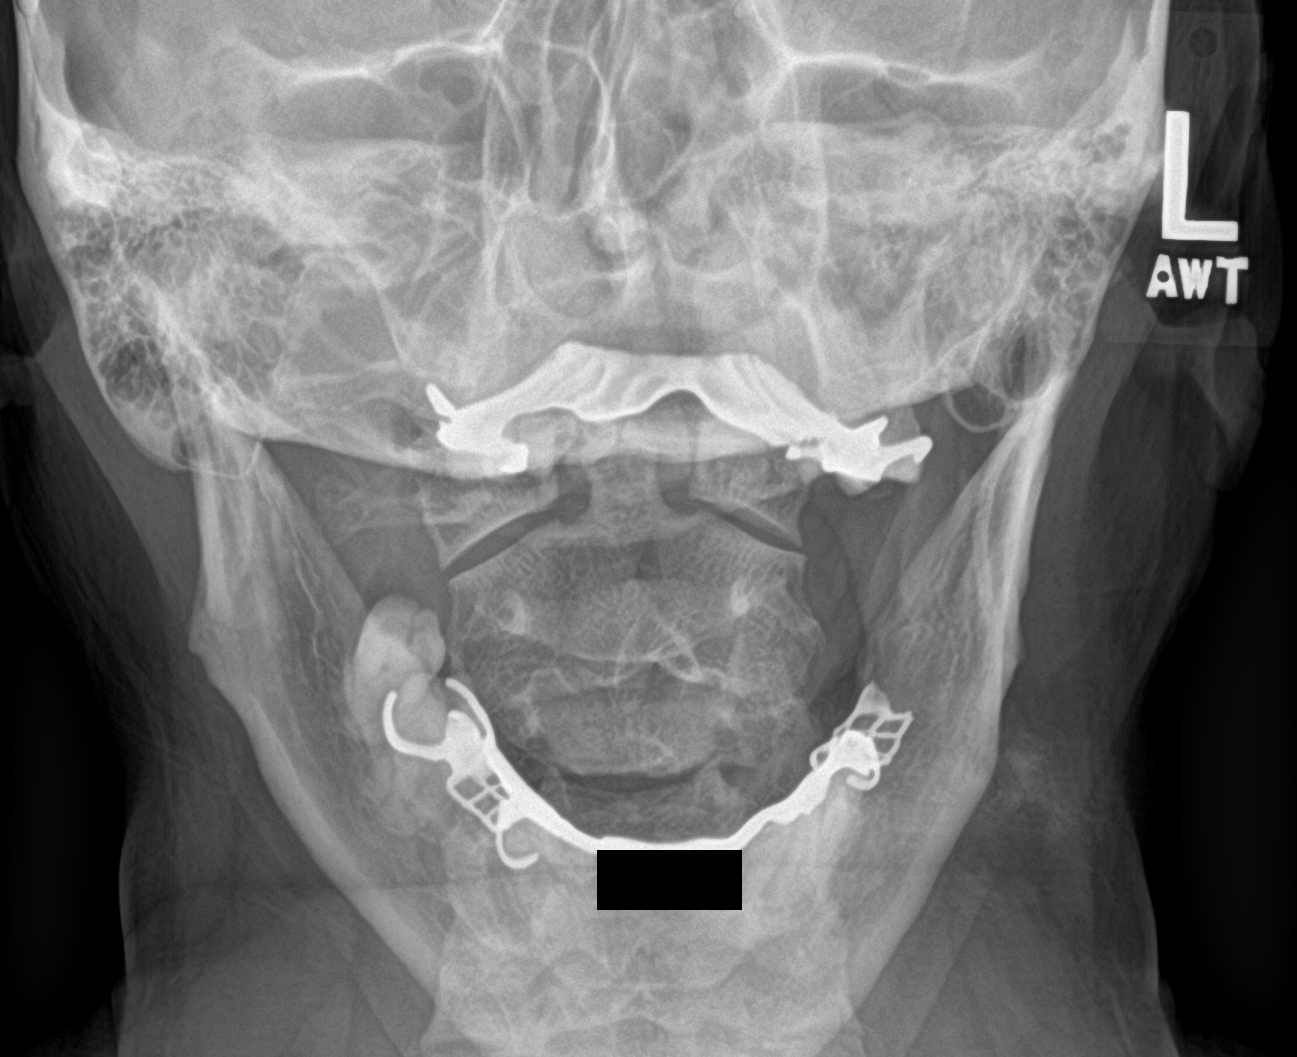

[3 of 3 positions shown; findings below may reference images not displayed]

FINDINGS: Diffuse degenerative change cervical spine. No acute bony
abnormality identified. No evidence of fracture dislocation.
IMPRESSION: Diffuse degenerative change.  No acute abnormality.
